# Patient Record
Sex: Male | Born: 1935 | Race: White | Hispanic: No | Marital: Single | Smoking: Never smoker
Health system: Southern US, Community
[De-identification: ages and names within clinical notes are randomized; demographics above are authoritative.]

## PROBLEM LIST (undated history)

## (undated) DIAGNOSIS — I272 Pulmonary hypertension, unspecified: Secondary | ICD-10-CM

## (undated) DIAGNOSIS — D61818 Other pancytopenia: Secondary | ICD-10-CM

## (undated) DIAGNOSIS — I350 Nonrheumatic aortic (valve) stenosis: Secondary | ICD-10-CM

## (undated) HISTORY — PX: TRANSURETHRAL RESECTION OF PROSTATE: SHX73

## (undated) HISTORY — PX: PORTACATH PLACEMENT: SHX2246

---

## 2015-08-24 DIAGNOSIS — D591 Autoimmune hemolytic anemia, unspecified: Secondary | ICD-10-CM

## 2015-08-24 DIAGNOSIS — C8470 Anaplastic large cell lymphoma, ALK-negative, unspecified site: Secondary | ICD-10-CM

## 2015-08-24 HISTORY — DX: Anaplastic large cell lymphoma, ALK-negative, unspecified site: C84.70

## 2015-08-24 HISTORY — DX: Autoimmune hemolytic anemia, unspecified: D59.10

## 2015-09-04 DIAGNOSIS — C8470 Anaplastic large cell lymphoma, ALK-negative, unspecified site: Secondary | ICD-10-CM

## 2021-01-02 ENCOUNTER — Emergency Department: Payer: Medicare Other

## 2021-01-02 ENCOUNTER — Encounter: Payer: Self-pay | Admitting: Family Medicine

## 2021-01-02 ENCOUNTER — Other Ambulatory Visit: Payer: Self-pay

## 2021-01-02 ENCOUNTER — Inpatient Hospital Stay
Admission: EM | Admit: 2021-01-02 | Discharge: 2021-01-08 | DRG: 871 | Disposition: A | Discharge status: Home Health | Payer: Medicare Other | Attending: Internal Medicine | Admitting: Internal Medicine

## 2021-01-02 DIAGNOSIS — I083 Combined rheumatic disorders of mitral, aortic and tricuspid valves: Secondary | ICD-10-CM | POA: Diagnosis present

## 2021-01-02 DIAGNOSIS — D61818 Other pancytopenia: Secondary | ICD-10-CM | POA: Diagnosis present

## 2021-01-02 DIAGNOSIS — Z808 Family history of malignant neoplasm of other organs or systems: Secondary | ICD-10-CM

## 2021-01-02 DIAGNOSIS — A419 Sepsis, unspecified organism: Principal | ICD-10-CM

## 2021-01-02 DIAGNOSIS — J9601 Acute respiratory failure with hypoxia: Secondary | ICD-10-CM

## 2021-01-02 DIAGNOSIS — Z886 Allergy status to analgesic agent status: Secondary | ICD-10-CM | POA: Diagnosis not present

## 2021-01-02 DIAGNOSIS — C8510 Unspecified B-cell lymphoma, unspecified site: Secondary | ICD-10-CM | POA: Diagnosis present

## 2021-01-02 DIAGNOSIS — R161 Splenomegaly, not elsewhere classified: Secondary | ICD-10-CM | POA: Diagnosis present

## 2021-01-02 DIAGNOSIS — J189 Pneumonia, unspecified organism: Secondary | ICD-10-CM | POA: Diagnosis not present

## 2021-01-02 DIAGNOSIS — R011 Cardiac murmur, unspecified: Secondary | ICD-10-CM | POA: Diagnosis not present

## 2021-01-02 DIAGNOSIS — N400 Enlarged prostate without lower urinary tract symptoms: Secondary | ICD-10-CM | POA: Diagnosis present

## 2021-01-02 DIAGNOSIS — Z85828 Personal history of other malignant neoplasm of skin: Secondary | ICD-10-CM

## 2021-01-02 DIAGNOSIS — H409 Unspecified glaucoma: Secondary | ICD-10-CM | POA: Diagnosis present

## 2021-01-02 DIAGNOSIS — E871 Hypo-osmolality and hyponatremia: Secondary | ICD-10-CM | POA: Diagnosis not present

## 2021-01-02 DIAGNOSIS — Z823 Family history of stroke: Secondary | ICD-10-CM

## 2021-01-02 DIAGNOSIS — M109 Gout, unspecified: Secondary | ICD-10-CM | POA: Diagnosis present

## 2021-01-02 DIAGNOSIS — E222 Syndrome of inappropriate secretion of antidiuretic hormone: Secondary | ICD-10-CM | POA: Diagnosis present

## 2021-01-02 DIAGNOSIS — Z20822 Contact with and (suspected) exposure to covid-19: Secondary | ICD-10-CM | POA: Diagnosis present

## 2021-01-02 DIAGNOSIS — Z79899 Other long term (current) drug therapy: Secondary | ICD-10-CM | POA: Diagnosis not present

## 2021-01-02 DIAGNOSIS — I452 Bifascicular block: Secondary | ICD-10-CM | POA: Diagnosis present

## 2021-01-02 DIAGNOSIS — R188 Other ascites: Secondary | ICD-10-CM

## 2021-01-02 DIAGNOSIS — C8300 Small cell B-cell lymphoma, unspecified site: Secondary | ICD-10-CM | POA: Diagnosis not present

## 2021-01-02 DIAGNOSIS — E860 Dehydration: Secondary | ICD-10-CM | POA: Diagnosis present

## 2021-01-02 DIAGNOSIS — C8590 Non-Hodgkin lymphoma, unspecified, unspecified site: Secondary | ICD-10-CM | POA: Diagnosis not present

## 2021-01-02 LAB — CBC WITH DIFFERENTIAL/PLATELET
Abs Immature Granulocytes: 0.02 10*3/uL (ref 0.00–0.07)
Basophils Absolute: 0 10*3/uL (ref 0.0–0.1)
Basophils Relative: 1 %
Eosinophils Absolute: 0 10*3/uL (ref 0.0–0.5)
Eosinophils Relative: 1 %
HCT: 28.6 % — ABNORMAL LOW (ref 39.0–52.0)
Hemoglobin: 9.9 g/dL — ABNORMAL LOW (ref 13.0–17.0)
Immature Granulocytes: 1 %
Lymphocytes Relative: 4 %
Lymphs Abs: 0.1 10*3/uL — ABNORMAL LOW (ref 0.7–4.0)
MCH: 27.5 pg (ref 26.0–34.0)
MCHC: 34.6 g/dL (ref 30.0–36.0)
MCV: 79.4 fL — ABNORMAL LOW (ref 80.0–100.0)
Monocytes Absolute: 0.1 10*3/uL (ref 0.1–1.0)
Monocytes Relative: 8 %
Neutro Abs: 1.4 10*3/uL — ABNORMAL LOW (ref 1.7–7.7)
Neutrophils Relative %: 85 %
Platelets: 118 10*3/uL — ABNORMAL LOW (ref 150–400)
RBC: 3.6 MIL/uL — ABNORMAL LOW (ref 4.22–5.81)
RDW: 14.6 % (ref 11.5–15.5)
WBC: 1.7 10*3/uL — ABNORMAL LOW (ref 4.0–10.5)
nRBC: 0 % (ref 0.0–0.2)

## 2021-01-02 LAB — COMPREHENSIVE METABOLIC PANEL
ALT: 10 U/L (ref 0–44)
AST: 24 U/L (ref 15–41)
Albumin: 3 g/dL — ABNORMAL LOW (ref 3.5–5.0)
Alkaline Phosphatase: 62 U/L (ref 38–126)
Anion gap: 9 (ref 5–15)
BUN: 18 mg/dL (ref 8–23)
CO2: 21 mmol/L — ABNORMAL LOW (ref 22–32)
Calcium: 8.4 mg/dL — ABNORMAL LOW (ref 8.9–10.3)
Chloride: 97 mmol/L — ABNORMAL LOW (ref 98–111)
Creatinine, Ser: 1.15 mg/dL (ref 0.61–1.24)
GFR, Estimated: >60 mL/min (ref >60)
Glucose, Bld: 129 mg/dL — ABNORMAL HIGH (ref 70–99)
Potassium: 3.7 mmol/L (ref 3.5–5.1)
Sodium: 127 mmol/L — ABNORMAL LOW (ref 135–145)
Total Bilirubin: 0.7 mg/dL (ref 0.3–1.2)
Total Protein: 5.8 g/dL — ABNORMAL LOW (ref 6.5–8.1)

## 2021-01-02 LAB — URINALYSIS, COMPLETE (UACMP) WITH MICROSCOPIC
Bacteria, UA: NONE SEEN
Bilirubin Urine: NEGATIVE
Glucose, UA: NEGATIVE mg/dL
Ketones, ur: NEGATIVE mg/dL
Leukocytes,Ua: NEGATIVE
Nitrite: NEGATIVE
Protein, ur: 30 mg/dL — AB
Specific Gravity, Urine: 1.009 (ref 1.005–1.030)
Squamous Epithelial / HPF: NONE SEEN (ref 0–5)
pH: 5 (ref 5.0–8.0)

## 2021-01-02 LAB — RESP PANEL BY RT-PCR (FLU A&B, COVID) ARPGX2
Influenza A by PCR: NEGATIVE
Influenza B by PCR: NEGATIVE
SARS Coronavirus 2 by RT PCR: NEGATIVE

## 2021-01-02 LAB — LACTIC ACID, PLASMA: Lactic Acid, Venous: 1.4 mmol/L (ref 0.5–1.9)

## 2021-01-02 LAB — TROPONIN I (HIGH SENSITIVITY)
Troponin I (High Sensitivity): 10 ng/L (ref <18)
Troponin I (High Sensitivity): 12 ng/L (ref <18)

## 2021-01-02 LAB — BRAIN NATRIURETIC PEPTIDE: B Natriuretic Peptide: 94.8 pg/mL (ref 0.0–100.0)

## 2021-01-02 MED ORDER — SODIUM CHLORIDE 0.9 % IV SOLN: INTRAVENOUS | Status: DC

## 2021-01-02 MED ORDER — LATANOPROST 0.005 % OP SOLN
1.0000 [drp] | Freq: Every day | OPHTHALMIC | Status: DC
  Administered 2021-01-02 – 2021-01-07 (×6): 1 [drp] via OPHTHALMIC
  Filled 2021-01-02: qty 2.5

## 2021-01-02 MED ORDER — DM-GUAIFENESIN ER 30-600 MG PO TB12
1.0000 | ORAL_TABLET | Freq: Two times a day (BID) | ORAL | Status: DC | PRN
  Administered 2021-01-02: 1 via ORAL
  Filled 2021-01-02: qty 1

## 2021-01-02 MED ORDER — ONDANSETRON HCL 4 MG/2ML IJ SOLN: 4.0000 mg | Freq: Four times a day (QID) | INTRAMUSCULAR | Status: DC | PRN

## 2021-01-02 MED ORDER — TAMSULOSIN HCL 0.4 MG PO CAPS
0.4000 mg | ORAL_CAPSULE | Freq: Every day | ORAL | Status: DC
  Administered 2021-01-02 – 2021-01-08 (×7): 0.4 mg via ORAL
  Filled 2021-01-02 (×7): qty 1

## 2021-01-02 MED ORDER — CEFTRIAXONE SODIUM 2 G IJ SOLR
2.0000 g | INTRAMUSCULAR | Status: AC
  Administered 2021-01-02 – 2021-01-06 (×5): 2 g via INTRAVENOUS
  Filled 2021-01-02 (×4): qty 2
  Filled 2021-01-02: qty 20

## 2021-01-02 MED ORDER — MAGNESIUM GLUCONATE 500 MG PO TABS
500.0000 mg | ORAL_TABLET | Freq: Every day | ORAL | Status: DC
  Administered 2021-01-03 – 2021-01-08 (×6): 500 mg via ORAL
  Filled 2021-01-02 (×6): qty 1

## 2021-01-02 MED ORDER — ENOXAPARIN SODIUM 40 MG/0.4ML IJ SOSY
40.0000 mg | PREFILLED_SYRINGE | INTRAMUSCULAR | Status: DC
  Administered 2021-01-02: 17:00:00 40 mg via SUBCUTANEOUS
  Filled 2021-01-02 (×2): qty 0.4

## 2021-01-02 MED ORDER — SODIUM CHLORIDE 0.9 % IV SOLN
500.0000 mg | INTRAVENOUS | Status: DC
  Administered 2021-01-02 – 2021-01-03 (×2): 500 mg via INTRAVENOUS
  Filled 2021-01-02 (×3): qty 500

## 2021-01-02 MED ORDER — ONDANSETRON HCL 4 MG PO TABS: 4.0000 mg | ORAL_TABLET | Freq: Four times a day (QID) | ORAL | Status: DC | PRN

## 2021-01-02 MED ORDER — FERROUS SULFATE 325 (65 FE) MG PO TABS
325.0000 mg | ORAL_TABLET | Freq: Every day | ORAL | Status: DC
  Administered 2021-01-03 – 2021-01-08 (×6): 325 mg via ORAL
  Filled 2021-01-02 (×6): qty 1

## 2021-01-02 MED ORDER — ALLOPURINOL 100 MG PO TABS
300.0000 mg | ORAL_TABLET | Freq: Every day | ORAL | Status: DC
  Administered 2021-01-02 – 2021-01-08 (×7): 300 mg via ORAL
  Filled 2021-01-02 (×3): qty 3
  Filled 2021-01-02: qty 1
  Filled 2021-01-02 (×4): qty 3

## 2021-01-02 MED ORDER — MAGNESIUM HYDROXIDE 400 MG/5ML PO SUSP
30.0000 mL | Freq: Every day | ORAL | Status: DC | PRN
  Filled 2021-01-02: qty 30

## 2021-01-02 MED ORDER — DM-GUAIFENESIN ER 30-600 MG PO TB12
1.0000 | ORAL_TABLET | Freq: Two times a day (BID) | ORAL | Status: DC
  Administered 2021-01-02 – 2021-01-08 (×12): 1 via ORAL
  Filled 2021-01-02 (×12): qty 1

## 2021-01-02 MED ORDER — TRAZODONE HCL 50 MG PO TABS
25.0000 mg | ORAL_TABLET | Freq: Every evening | ORAL | Status: DC | PRN
  Administered 2021-01-02 – 2021-01-07 (×3): 25 mg via ORAL
  Filled 2021-01-02 (×4): qty 1

## 2021-01-02 MED ORDER — VITAMIN B-12 1000 MCG PO TABS
1000.0000 ug | ORAL_TABLET | Freq: Every day | ORAL | Status: DC
  Administered 2021-01-03 – 2021-01-08 (×6): 1000 ug via ORAL
  Filled 2021-01-02 (×6): qty 1

## 2021-01-02 NOTE — ED Provider Notes (Addendum)
[facility] Emergency Department Provider Note  ____________________________________________   Event Date/Time   First MD Initiated Contact with Patient 01/02/21 1352     (approximate)  I have reviewed the triage vital signs and the nursing notes.   HISTORY  Chief Complaint Weakness    HPI Harry Strong is a 86 y.o. male here with generalized weakness and shortness of breath.  The patient states that for the last 6 weeks or so, he has had progressively worsening general fatigue, loss of appetite, shortness of breath.  He currently splits his time between [place] and [place].  He had an appointment with his cancer doctor for B-cell lymphoma at the end of April and he states everything looked okay.  He states his counts were acceptable.  He states that over the last several weeks, he has felt fatigued.  He has not wanted to eat or drink much.  He has had progressive shortness of breath, worse with exertion.  He is having to stop to rest to breathe with even minimal exertion.  No chest pain.  He has had some mild leg swelling.  No fevers.  Denies preceding URI or other infectious symptoms.  No abdominal pain.        History reviewed. No pertinent past medical history.  Patient Active Problem List   Diagnosis Date Noted  . Multifocal pneumonia 01/02/2021      Prior to Admission medications   Medication Sig Start Date End Date Taking? Authorizing Provider  allopurinol (ZYLOPRIM) 300 MG tablet Take 300 mg by mouth daily.   Yes [provider]  ferrous sulfate 325 (65 FE) MG tablet Take 1 mg by mouth daily.   Yes [provider]  latanoprost (XALATAN) 0.005 % ophthalmic solution Place 1 drop into both eyes at bedtime.   Yes [provider]  magnesium gluconate (MAGONATE) 500 MG tablet Take 500 mg by mouth daily.   Yes [provider]  tamsulosin (FLOMAX) 0.4 MG CAPS capsule Take 0.4 mg by mouth daily.   Yes  [provider]  vitamin B-12 (CYANOCOBALAMIN) 1000 MCG tablet Take 1,000 mcg by mouth daily.   Yes [provider]    Allergies Aspirin and Tylenol [acetaminophen]  History reviewed. No pertinent family history.  Social History Social History   Tobacco Use  . Smoking status: Never Smoker  Substance Use Topics  . Alcohol use: Not Currently  . Drug use: Never    Review of Systems  Review of Systems  Constitutional: Positive for appetite change and fatigue. Negative for chills and fever.  HENT: Negative for sore throat.   Respiratory: Positive for shortness of breath.   Cardiovascular: Negative for chest pain.  Gastrointestinal: Negative for abdominal pain.  Genitourinary: Negative for flank pain.  Musculoskeletal: Negative for neck pain.  Skin: Negative for rash and wound.  Allergic/Immunologic: Negative for immunocompromised state.  Neurological: Positive for weakness. Negative for numbness.  Hematological: Does not bruise/bleed easily.  All other systems reviewed and are negative.    ____________________________________________  PHYSICAL EXAM:      VITAL SIGNS: ED Triage Vitals  Enc Vitals Group     BP 01/02/21 1351 135/69     Pulse Rate 01/02/21 1351 78     Resp 01/02/21 1351 18     Temp 01/02/21 1351 98.6 F (37 C)     Temp Source 01/02/21 1351 Oral     SpO2 01/02/21 1351 93 %     Weight 01/02/21 1352  180 lb (81.6 kg)     Height 01/02/21 1352 5\' 9"  (1.753 m)     Head Circumference --      Peak Flow --      Pain Score 01/02/21 1352 0     Pain Loc --      Pain Edu? --      Excl. in [place]? --      Physical Exam Vitals and nursing note reviewed.  Constitutional:      General: He is not in acute distress.    Appearance: He is well-developed.  HENT:     Head: Normocephalic and atraumatic.  Eyes:     Conjunctiva/sclera: Conjunctivae normal.  Cardiovascular:     Rate and Rhythm: Normal rate and regular rhythm.     Heart sounds: Normal  heart sounds. No murmur heard. No friction rub.  Pulmonary:     Effort: Pulmonary effort is normal. No respiratory distress.     Breath sounds: Examination of the right-upper field reveals rales. Examination of the left-upper field reveals rales. Examination of the right-middle field reveals rales. Examination of the left-middle field reveals rales. Examination of the right-lower field reveals rales. Examination of the left-lower field reveals rales. Rales present. No wheezing.  Abdominal:     General: There is no distension.     Palpations: Abdomen is soft.     Tenderness: There is no abdominal tenderness.  Musculoskeletal:     Cervical back: Neck supple.     Right lower leg: Edema present.     Left lower leg: Edema present.  Skin:    General: Skin is warm.     Capillary Refill: Capillary refill takes less than 2 seconds.  Neurological:     Mental Status: He is alert and oriented to person, place, and time.     Motor: No abnormal muscle tone.       ____________________________________________   LABS (all labs ordered are listed, but only abnormal results are displayed)  Labs Reviewed  CBC WITH DIFFERENTIAL/PLATELET - Abnormal; Notable for the following components:      Result Value   WBC 1.7 (*)    RBC 3.60 (*)    Hemoglobin 9.9 (*)    HCT 28.6 (*)    MCV 79.4 (*)    Platelets 118 (*)    Neutro Abs 1.4 (*)    Lymphs Abs 0.1 (*)    All other components within normal limits  COMPREHENSIVE METABOLIC PANEL - Abnormal; Notable for the following components:   Sodium 127 (*)    Chloride 97 (*)    CO2 21 (*)    Glucose, Bld 129 (*)    Calcium 8.4 (*)    Total Protein 5.8 (*)    Albumin 3.0 (*)    All other components within normal limits  URINALYSIS, COMPLETE (UACMP) WITH MICROSCOPIC - Abnormal; Notable for the following components:   Color, Urine YELLOW (*)    APPearance CLEAR (*)    Hgb urine dipstick SMALL (*)    Protein, ur 30 (*)    All other components within  normal limits  RESP PANEL BY RT-PCR (FLU A&B, COVID) ARPGX2  BRAIN NATRIURETIC PEPTIDE  BASIC METABOLIC PANEL  CBC  TROPONIN I (HIGH SENSITIVITY)  TROPONIN I (HIGH SENSITIVITY)    ____________________________________________  EKG: Normal sinus rhythm, VR 77. PR 217, QRS 156, QTc 435. Bolrderline prolonged PR. RBBB. No acute ST elevation or depression. ________________________________________  RADIOLOGY All imaging, including plain films, CT scans, and ultrasounds, independently  reviewed by me, and interpretations confirmed via formal radiology reads.  ED MD interpretation:   CXR: Multifocal PNA vs nodules  Official radiology report(s): CT Chest Wo Contrast  Result Date: 01/02/2021 CLINICAL DATA:  Cough, lung nodules, generalized weakness, shortness of breath EXAM: CT CHEST WITHOUT CONTRAST TECHNIQUE: Multidetector CT imaging of the chest was performed following the standard protocol without IV contrast. Sagittal and coronal MPR images reconstructed from axial data set. COMPARISON:  Chest radiograph 01/02/2021 FINDINGS: Cardiovascular: Atherosclerotic calcifications aorta, proximal great vessels and coronary arteries. Upper normal caliber ascending thoracic aorta 3.9 cm diameter. Mitral annular and aortic valvular calcifications. No pericardial effusion. RIGHT jugular Port-A-Cath with tip in SVC. Mediastinum/Nodes: Esophagus unremarkable. Base of cervical region normal appearance. Few scattered normal size mediastinal lymph nodes without definite thoracic adenopathy. Lungs/Pleura: Small BILATERAL pleural effusions. Patchy predominantly peribronchial infiltrates throughout both lungs involving all lobes consistent with multifocal pneumonia. No pneumothorax. No discrete pulmonary mass. Upper Abdomen: Spleen appears enlarged, 13.6 x 8.9 cm image 145. Remaining visualized upper abdomen unremarkable. Musculoskeletal: No osseous abnormalities. Tiny subcutaneous nodule presternal 11 mm greatest  diameter image 74 question epidermal inclusion cyst versus sebaceous cyst. IMPRESSION: Extensive BILATERAL patchy pulmonary infiltrates, predominantly perivascular, consistent with multifocal pneumonia. Radiographic follow-up until resolution recommended to exclude underlying pulmonary nodules. Small BILATERAL pleural effusions. Mild splenic enlargement. Scattered atherosclerotic calcifications including coronary arteries. Aortic Atherosclerosis (ICD10-I70.0). Electronically Signed   By: Lavonia Dana M.D.   On: 01/02/2021 15:55   DG Chest Portable 1 View  Result Date: 01/02/2021 CLINICAL DATA:  Cough and short of breath EXAM: PORTABLE CHEST 1 VIEW COMPARISON:  None. FINDINGS: Right-sided central venous port tip over the distal SVC. Widespread bilateral airspace opacities some of which are nodular in appearance. Possible small right effusion. Borderline cardiomegaly with aortic atherosclerosis. No pneumothorax. IMPRESSION: Widespread bilateral airspace opacities, some of which are nodular in appearance. Findings could be secondary to diffuse bilateral pneumonia, however given nodular appearance, the possibility of pulmonary nodules is also raised. Suggest chest CT for further evaluation. Electronically Signed   By: Donavan Foil M.D.   On: 01/02/2021 15:26    ____________________________________________  PROCEDURES   Procedure(s) performed (including Critical Care):  .1-3 Lead EKG Interpretation Performed by: Duffy Bruce, MD Authorized by: Duffy Bruce, MD     Interpretation: normal     ECG rate:  70-80   ECG rate assessment: normal     Rhythm: sinus rhythm     Ectopy: none     Conduction: normal   Comments:     Indication: sob    ____________________________________________  INITIAL IMPRESSION / MDM / ASSESSMENT AND PLAN / ED COURSE  As part of my medical decision making, I reviewed the following data within the [facility] notes reviewed and  incorporated, Old chart reviewed, Notes from prior ED visits, and [place] Controlled Substance Database       *Gill Delrossi was evaluated in Emergency Department on 01/02/2021 for the symptoms described in the history of present illness. He was evaluated in the context of the global COVID-19 pandemic, which necessitated consideration that the patient might be at risk for infection with the SARS-CoV-2 virus that causes COVID-19. Institutional protocols and algorithms that pertain to the evaluation of patients at risk for COVID-19 are in a state of rapid change based on information released by regulatory bodies including the CDC and federal and state organizations. These policies and algorithms were followed during the patient's care in the ED.  Some ED  evaluations and interventions may be delayed as a result of limited staffing during the pandemic.*     Medical Decision Making:  86 yo M here with generalized weakness and loss of appetite. CXR shows multifocal PNA vs nodules. Suspect possible viral or atypical PNA, COVID, possibly metastatic disease with h/o B-Cell lymphoma. No known pulmonary history. He is mildly hypoxic with minimal exertion in ED. EKG nonischemic, no CP or signs of ACS. No clinical evidence of PE. Labs show significant hyponatremia, likely related to his pulm disease and mild dehydration. Pt also pancytopenic, which could be related to his lymphoma though also viral marrow suppression. Will admit for further management. CT pending.   ____________________________________________  FINAL CLINICAL IMPRESSION(S) / ED DIAGNOSES  Final diagnoses:  Acute respiratory failure with hypoxia (HCC)  Multifocal pneumonia     MEDICATIONS GIVEN DURING THIS VISIT:  Medications  allopurinol (ZYLOPRIM) tablet 300 mg (300 mg Oral Given 01/02/21 1714)  tamsulosin (FLOMAX) capsule 0.4 mg (0.4 mg Oral Given 01/02/21 1712)  ferrous sulfate tablet 325 mg (has no administration in time range)   magnesium gluconate (MAGONATE) tablet 500 mg (has no administration in time range)  vitamin B-12 (CYANOCOBALAMIN) tablet 1,000 mcg (has no administration in time range)  latanoprost (XALATAN) 0.005 % ophthalmic solution 1 drop (has no administration in time range)  enoxaparin (LOVENOX) injection 40 mg (40 mg Subcutaneous Given 01/02/21 1712)  0.9 %  sodium chloride infusion ( Intravenous New Bag/Given 01/02/21 1706)  traZODone (DESYREL) tablet 25 mg (has no administration in time range)  magnesium hydroxide (MILK OF MAGNESIA) suspension 30 mL (has no administration in time range)  ondansetron (ZOFRAN) tablet 4 mg (has no administration in time range)    Or  ondansetron (ZOFRAN) injection 4 mg (has no administration in time range)  cefTRIAXone (ROCEPHIN) 2 g in sodium chloride 0.9 % 100 mL IVPB (2 g Intravenous New Bag/Given 01/02/21 1714)  azithromycin (ZITHROMAX) 500 mg in sodium chloride 0.9 % 250 mL IVPB (has no administration in time range)  dextromethorphan-guaiFENesin (MUCINEX DM) 30-600 MG per 12 hr tablet 1 tablet (has no administration in time range)  dextromethorphan-guaiFENesin (MUCINEX DM) 30-600 MG per 12 hr tablet 1 tablet (1 tablet Oral Given 01/02/21 1711)     ED Discharge Orders    None       Note:  This document was prepared using Dragon voice recognition software and may include unintentional dictation errors.   Duffy Bruce, MD 01/02/21 1719    Duffy Bruce, MD 01/02/21 1719

## 2021-01-02 NOTE — ED Triage Notes (Signed)
Pt states that he has had increased weakness for the past 6 weeks states that he thought may be it was his blood Oxygen level but his pcp said that was normal, pt reports lack of appetite and weakness that isn't resolving pt denies dark stools and states that due to his lack of intake he has had less bm's

## 2021-01-02 NOTE — ED Notes (Signed)
Pt states that he is having generalized weakness and shortness of breath for about 6 weeks. Pt has hx/o cancer.   Pt SpO2 92% on room air, drops to 91% when talking. EDP at bedside.

## 2021-01-02 NOTE — ED Notes (Signed)
Attending MD at bedside.

## 2021-01-02 NOTE — H&P (Addendum)
[place]   PATIENT NAME: Harry Strong    MR#:  [id number]  DATE OF BIRTH:  1935/05/28  DATE OF ADMISSION:  01/02/2021  PRIMARY CARE PHYSICIAN: Pcp, No   Patient is coming from: Home  REQUESTING/REFERRING PHYSICIAN: Duffy Bruce, MD CHIEF COMPLAINT:   Chief Complaint  Patient presents with  . Weakness    HISTORY OF PRESENT ILLNESS:  Harry Strong is a 86 y.o. male with medical history significant for B-cell lymphoma, BPH, gout and pancytopenia, who presented to the ER with acute onset of worsening dyspnea with associated to cough productive of clear sputum without wheezing.  He has been having intermittent low-grade fever that was up to 99 over the last few days.  He denies any chills.  No nausea or vomiting or abdominal pain.  He denies any chest pain or palpitations.  No dysuria, degree of hematuria or flank pain.  ED Course: When he came to the ER vital signs were within normal.  His pulse oximetry briefly dropped to 88% on room air.  Later respiratory rate was 27 then 25.  Labs revealed leukopenia of 1.7 with anemia of 9.9/28.6 and thrombocytopenia 118 with neutropenia with ANC of 1.4.  UA was unremarkable EKG as reviewed by me : Showed sinus rhythm with a rate of 77 with borderline prolonged PR interval, right bundle blanch block and left anterior fascicular block with Q waves anteroseptally. Imaging: Chest x-ray showed widespread bilateral airspace opacities, some of which are nodular in appearance. Findings could be secondary to diffuse bilateral pneumonia, however given nodular appearance, the possibility of pulmonary nodules is also raised. Chest CT without contrast revealed: Extensive BILATERAL patchy pulmonary infiltrates, predominantly perivascular, consistent with multifocal pneumonia.  Radiographic follow-up until resolution recommended to exclude underlying pulmonary nodules.  Small BILATERAL pleural effusions.  Mild splenic  enlargement.  Scattered atherosclerotic calcifications including coronary arteries.  Aortic Atherosclerosis  The patient will be admitted to a medical monitored bed for further evaluation and management. PAST MEDICAL HISTORY:  B-cell lymphoma, BPH, gout and pancytopenia as well as facial skin cancer.  PAST SURGICAL HISTORY:  TURP per his report.  SOCIAL HISTORY:   Social History   Tobacco Use  . Smoking status: Not on file  . Smokeless tobacco: Not on file  Substance Use Topics  . Alcohol use: Not on file  He drinks alcohol occasionally.  No history of tobacco or EtOH abuse or illicit drug use.  FAMILY HISTORY:  His father had CVA at age of 1.  History is otherwise positive for skin cancer in his brother. DRUG ALLERGIES:   Allergies  Allergen Reactions  . Aspirin Swelling  . Tylenol [Acetaminophen] Other (See Comments)    Red face, tongue, and lips    REVIEW OF SYSTEMS:   ROS As per history of present illness. All pertinent systems were reviewed above. Constitutional, HEENT, cardiovascular, respiratory, GI, GU, musculoskeletal, neuro, psychiatric, endocrine, integumentary and hematologic systems were reviewed and are otherwise negative/unremarkable except for positive findings mentioned above in the HPI.   MEDICATIONS AT HOME:   Prior to Admission medications   Medication Sig Start Date End Date Taking? Authorizing Provider  allopurinol (ZYLOPRIM) 300 MG tablet Take 300 mg by mouth daily.   Yes [provider]  ferrous sulfate 325 (65 FE) MG tablet Take 1 mg by mouth daily.   Yes [provider]  latanoprost (XALATAN) 0.005 % ophthalmic solution Place 1 drop into both eyes at bedtime.   Yes [provider]  magnesium gluconate (MAGONATE) 500 MG tablet Take 500 mg by mouth daily.   Yes [provider]  tamsulosin (FLOMAX) 0.4 MG CAPS capsule Take 0.4 mg by mouth daily.   Yes [provider]  vitamin B-12  (CYANOCOBALAMIN) 1000 MCG tablet Take 1,000 mcg by mouth daily.   Yes [provider]      VITAL SIGNS:  Blood pressure 123/81, pulse 73, temperature 98.6 F (37 C), temperature source Oral, resp. rate 19, height 5\' 9"  (1.753 m), weight 81.6 kg, SpO2 94 %.  PHYSICAL EXAMINATION:  Physical Exam  GENERAL:  86 y.o.-year-old Caucasian male patient lying in the bed with no acute distress.  EYES: Pupils equal, round, reactive to light and accommodation. No scleral icterus. Extraocular muscles intact.  HEENT: Head atraumatic, normocephalic. Oropharynx and nasopharynx clear.  NECK:  Supple, no jugular venous distention. No thyroid enlargement, no tenderness.  LUNGS: Normal breath sounds bilaterally, no wheezing, rales,rhonchi or crepitation. No use of accessory muscles of respiration.  CARDIOVASCULAR: Regular rate and rhythm, S1, S2 normal. No murmurs, rubs, or gallops.  ABDOMEN: Soft, nondistended, nontender. Bowel sounds present. No organomegaly or mass.  EXTREMITIES: No pedal edema, cyanosis, or clubbing.  NEUROLOGIC: Cranial nerves II through XII are intact. Muscle strength 5/5 in all extremities. Sensation intact. Gait not checked.  PSYCHIATRIC: The patient is alert and oriented x 3.  Normal affect and good eye contact. SKIN: He has a left facial ulcer likely at the site of treated basal cell carcinoma.  LABORATORY PANEL:   CBC Recent Labs  Lab 01/02/21 1404  WBC 1.7*  HGB 9.9*  HCT 28.6*  PLT 118*   ------------------------------------------------------------------------------------------------------------------  Chemistries  Recent Labs  Lab 01/02/21 1404  NA 127*  K 3.7  CL 97*  CO2 21*  GLUCOSE 129*  BUN 18  CREATININE 1.15  CALCIUM 8.4*  AST 24  ALT 10  ALKPHOS 62  BILITOT 0.7   ------------------------------------------------------------------------------------------------------------------  Cardiac Enzymes No results for input(s): TROPONINI in the  last 168 hours. ------------------------------------------------------------------------------------------------------------------  RADIOLOGY:  CT Chest Wo Contrast  Result Date: 01/02/2021 CLINICAL DATA:  Cough, lung nodules, generalized weakness, shortness of breath EXAM: CT CHEST WITHOUT CONTRAST TECHNIQUE: Multidetector CT imaging of the chest was performed following the standard protocol without IV contrast. Sagittal and coronal MPR images reconstructed from axial data set. COMPARISON:  Chest radiograph 01/02/2021 FINDINGS: Cardiovascular: Atherosclerotic calcifications aorta, proximal great vessels and coronary arteries. Upper normal caliber ascending thoracic aorta 3.9 cm diameter. Mitral annular and aortic valvular calcifications. No pericardial effusion. RIGHT jugular Port-A-Cath with tip in SVC. Mediastinum/Nodes: Esophagus unremarkable. Base of cervical region normal appearance. Few scattered normal size mediastinal lymph nodes without definite thoracic adenopathy. Lungs/Pleura: Small BILATERAL pleural effusions. Patchy predominantly peribronchial infiltrates throughout both lungs involving all lobes consistent with multifocal pneumonia. No pneumothorax. No discrete pulmonary mass. Upper Abdomen: Spleen appears enlarged, 13.6 x 8.9 cm image 145. Remaining visualized upper abdomen unremarkable. Musculoskeletal: No osseous abnormalities. Tiny subcutaneous nodule presternal 11 mm greatest diameter image 74 question epidermal inclusion cyst versus sebaceous cyst. IMPRESSION: Extensive BILATERAL patchy pulmonary infiltrates, predominantly perivascular, consistent with multifocal pneumonia. Radiographic follow-up until resolution recommended to exclude underlying pulmonary nodules. Small BILATERAL pleural effusions. Mild splenic enlargement. Scattered atherosclerotic calcifications including coronary arteries. Aortic Atherosclerosis (ICD10-I70.0). Electronically Signed   By: Lavonia Dana M.D.   On:  01/02/2021 15:55   DG Chest Portable 1 View  Result Date: 01/02/2021 CLINICAL DATA:  Cough and short of breath EXAM: PORTABLE  CHEST 1 VIEW COMPARISON:  None. FINDINGS: Right-sided central venous port tip over the distal SVC. Widespread bilateral airspace opacities some of which are nodular in appearance. Possible small right effusion. Borderline cardiomegaly with aortic atherosclerosis. No pneumothorax. IMPRESSION: Widespread bilateral airspace opacities, some of which are nodular in appearance. Findings could be secondary to diffuse bilateral pneumonia, however given nodular appearance, the possibility of pulmonary nodules is also raised. Suggest chest CT for further evaluation. Electronically Signed   By: Donavan Foil M.D.   On: 01/02/2021 15:26      IMPRESSION AND PLAN:  Active Problems:   Multifocal pneumonia  1.  Multifocal pneumonia with subsequent acute respiratory failure with hypoxia.  The patient has subsequent sepsis as manifested by leukopenia and tachypnea. - The patient will be admitted to a medical monitored bed. - Will place the patient on IV Rocephin and Zithromax. - We will follow blood and check sputum culture. - Mucolytic therapy as well as bronchodilator therapy will be provided. - O2 protocol will be followed. - We will follow blood cultures. - The patient will be hydrated with IV normal saline. - We will follow lactic acid level.  2.  Pancytopenia with neutropenia and B-cell lymphoma. - The patient will be placed on reverse isolation and will follow CBC.. - Continues vitamin B12.  3.  BPH. - We will continue Flomax.  4. Gout. - Continue allopurinol.  5.  Glaucoma. - We will continue his ophthalmic gtt.  DVT prophylaxis: Lovenox. Code Status: full code. Family Communication:  The plan of care was discussed in details with the patient (and his friend who was with him in the room). I answered all questions. The patient agreed to proceed with the above  mentioned plan. Further management will depend upon hospital course. Disposition Plan: Back to previous home environment Consults called: none.   All the records are reviewed and case discussed with ED provider.  Status is: Inpatient  Remains inpatient appropriate because:Ongoing diagnostic testing needed not appropriate for outpatient work up, Unsafe d/c plan, IV treatments appropriate due to intensity of illness or inability to take PO and Inpatient level of care appropriate due to severity of illness   Dispo: The patient is from: Home              Anticipated d/c is to: Home              Patient currently is not medically stable to d/c.   Difficult to place patient No      TOTAL TIME TAKING CARE OF THIS PATIENT: 55 minutes.    Christel Mormon M.D on 01/02/2021 at 3:59 PM  [facility]   From 7 PM-7 AM, contact night-coverage [email or web address]  CC: Primary care physician; Pcp, No

## 2021-01-02 NOTE — ED Notes (Addendum)
Stood patient up. Oxygen dropped to 88% on RA. Xray at bedside.

## 2021-01-02 NOTE — ED Notes (Signed)
2nd message sent to IP RN for handoff

## 2021-01-03 DIAGNOSIS — J9601 Acute respiratory failure with hypoxia: Secondary | ICD-10-CM

## 2021-01-03 DIAGNOSIS — E871 Hypo-osmolality and hyponatremia: Secondary | ICD-10-CM

## 2021-01-03 LAB — CBC
HCT: 27.4 % — ABNORMAL LOW (ref 39.0–52.0)
Hemoglobin: 9.4 g/dL — ABNORMAL LOW (ref 13.0–17.0)
MCH: 27.6 pg (ref 26.0–34.0)
MCHC: 34.3 g/dL (ref 30.0–36.0)
MCV: 80.4 fL (ref 80.0–100.0)
Platelets: 94 10*3/uL — ABNORMAL LOW (ref 150–400)
RBC: 3.41 MIL/uL — ABNORMAL LOW (ref 4.22–5.81)
RDW: 14.6 % (ref 11.5–15.5)
WBC: 1.3 10*3/uL — CL (ref 4.0–10.5)
nRBC: 0 % (ref 0.0–0.2)

## 2021-01-03 LAB — BASIC METABOLIC PANEL
Anion gap: 7 (ref 5–15)
BUN: 19 mg/dL (ref 8–23)
CO2: 22 mmol/L (ref 22–32)
Calcium: 8.1 mg/dL — ABNORMAL LOW (ref 8.9–10.3)
Chloride: 98 mmol/L (ref 98–111)
Creatinine, Ser: 1.08 mg/dL (ref 0.61–1.24)
GFR, Estimated: >60 mL/min (ref >60)
Glucose, Bld: 104 mg/dL — ABNORMAL HIGH (ref 70–99)
Potassium: 3.8 mmol/L (ref 3.5–5.1)
Sodium: 127 mmol/L — ABNORMAL LOW (ref 135–145)

## 2021-01-03 LAB — LACTIC ACID, PLASMA: Lactic Acid, Venous: 0.9 mmol/L (ref 0.5–1.9)

## 2021-01-03 NOTE — Plan of Care (Signed)
Notified on call MD of critical WBC of 1.3 - was 1.7. No new orders.

## 2021-01-03 NOTE — Progress Notes (Signed)
Order requisition for Advanced Directive information. Attempted to meet with patient, he was sleeping. Left packet on table with other personal items. Will follow up.

## 2021-01-03 NOTE — Progress Notes (Addendum)
PROGRESS NOTE    Harry Strong  [id number] DOB: 1934-11-24 DOA: 01/02/2021 PCP: Merryl Hacker, No    Brief Narrative:  Harry Strong is a 86 y.o. male with medical history significant for B-cell lymphoma, BPH, gout and pancytopenia, who presented to the ER with acute onset of worsening dyspnea with associated to cough productive of clear sputum without wheezing.  He has been having intermittent low-grade fever that was up to 99 over the last few days.  He denies any chills.  No nausea or vomiting or abdominal pain.  He denies any chest pain or palpitations.  No dysuria, degree of hematuria or flank pain. ED Course: When he came to the ER vital signs were within normal.  His pulse oximetry briefly dropped to 88% on room air.  Later respiratory rate was 27 then 25.  Labs revealed leukopenia of 1.7 with anemia of 9.9/28.6 and thrombocytopenia 118 with neutropenia with ANC of 1.4.  UA was unremarkable  cxr with b/l infiltrates. Being treated for PAN.  5/14- pt reports "feeling ok", little better.  On 2 L satting 94%.  Reports could not sleep last night due to interruptions. Still feels sob.    Consultants:     Procedures:   Antimicrobials:   Ceftriaxone and azithromycin 5/13>>>>   Subjective: No chest pain.  Still with sob  Objective: Vitals:   01/02/21 2114 01/03/21 0018 01/03/21 0457 01/03/21 0745  BP: 110/61 (!) 116/57 116/62 104/61  Pulse: 75 75 66 68  Resp: 17 18 16 17   Temp: 99 F (37.2 C) 98.7 F (37.1 C) 98.3 F (36.8 C) 98.2 F (36.8 C)  TempSrc:      SpO2: (!) 87% 95% 96% 94%  Weight:      Height:        Intake/Output Summary (Last 24 hours) at 01/03/2021 0816 Last data filed at 01/03/2021 0418 Gross per 24 hour  Intake 362.15 ml  Output 950 ml  Net -587.85 ml   Filed Weights   01/02/21 1352  Weight: 81.6 kg    Examination:  General exam: Appears calm and comfortable, pale Respiratory system: Positive crackles bilaterally right more than left, no  wheezing Cardiovascular system: S1 & S2 heard, RRR. No JVD, murmurs, rubs, gallops or clicks.  Gastrointestinal system: Abdomen is nondistended, soft and nontender.. Normal bowel sounds heard. Central nervous system: Alert and oriented.  Grossly intact Extremities: No edema Skin: Warm dry Psychiatry:Mood & affect appropriate.     Data Reviewed: I have personally reviewed following labs and imaging studies  CBC: Recent Labs  Lab 01/02/21 1404 01/03/21 0153  WBC 1.7* 1.3*  NEUTROABS 1.4*  --   HGB 9.9* 9.4*  HCT 28.6* 27.4*  MCV 79.4* 80.4  PLT 118* 94*   Basic Metabolic Panel: Recent Labs  Lab 01/02/21 1404 01/03/21 0153  NA 127* 127*  K 3.7 3.8  CL 97* 98  CO2 21* 22  GLUCOSE 129* 104*  BUN 18 19  CREATININE 1.15 1.08  CALCIUM 8.4* 8.1*   GFR: Estimated Creatinine Clearance: 50.9 mL/min (by C-G formula based on SCr of 1.08 mg/dL). Liver Function Tests: Recent Labs  Lab 01/02/21 1404  AST 24  ALT 10  ALKPHOS 62  BILITOT 0.7  PROT 5.8*  ALBUMIN 3.0*   No results for input(s): LIPASE, AMYLASE in the last 168 hours. No results for input(s): AMMONIA in the last 168 hours. Coagulation Profile: No results for input(s): INR, PROTIME in the last 168 hours. Cardiac Enzymes: No results for input(s):  CKTOTAL, CKMB, CKMBINDEX, TROPONINI in the last 168 hours. BNP (last 3 results) No results for input(s): PROBNP in the last 8760 hours. HbA1C: No results for input(s): HGBA1C in the last 72 hours. CBG: No results for input(s): GLUCAP in the last 168 hours. Lipid Profile: No results for input(s): CHOL, HDL, LDLCALC, TRIG, CHOLHDL, LDLDIRECT in the last 72 hours. Thyroid Function Tests: No results for input(s): TSH, T4TOTAL, FREET4, T3FREE, THYROIDAB in the last 72 hours. Anemia Panel: No results for input(s): VITAMINB12, FOLATE, FERRITIN, TIBC, IRON, RETICCTPCT in the last 72 hours. Sepsis Labs: Recent Labs  Lab 01/02/21 2322 01/03/21 0153  LATICACIDVEN 1.4  0.9    Recent Results (from the past 240 hour(s))  Resp Panel by RT-PCR (Flu A&B, Covid) Nasopharyngeal Swab     Status: None   Collection Time: 01/02/21  3:08 PM   Specimen: Nasopharyngeal Swab; Nasopharyngeal(NP) swabs in vial transport medium  Result Value Ref Range Status   SARS Coronavirus 2 by RT PCR NEGATIVE NEGATIVE Final    Comment: (NOTE) SARS-CoV-2 target nucleic acids are NOT DETECTED.  The SARS-CoV-2 RNA is generally detectable in upper respiratory specimens during the acute phase of infection. The lowest concentration of SARS-CoV-2 viral copies this assay can detect is 138 copies/mL. A negative result does not preclude SARS-Cov-2 infection and should not be used as the sole basis for treatment or other patient management decisions. A negative result may occur with  improper specimen collection/handling, submission of specimen other than nasopharyngeal swab, presence of viral mutation(s) within the areas targeted by this assay, and inadequate number of viral copies(<138 copies/mL). A negative result must be combined with clinical observations, patient history, and epidemiological information. The expected result is Negative.  Fact Sheet for Patients:  EntrepreneurPulse.com.au  Fact Sheet for Healthcare Providers:  IncredibleEmployment.be  This test is no t yet approved or cleared by the [place] FDA and  has been authorized for detection and/or diagnosis of SARS-CoV-2 by FDA under an Emergency Use Authorization (EUA). This EUA will remain  in effect (meaning this test can be used) for the duration of the COVID-19 declaration under Section 564(b)(1) of the Act, 21 U.S.C.section 360bbb-3(b)(1), unless the authorization is terminated  or revoked sooner.       Influenza A by PCR NEGATIVE NEGATIVE Final   Influenza B by PCR NEGATIVE NEGATIVE Final    Comment: (NOTE) The Xpert Xpress SARS-CoV-2/FLU/RSV plus assay is intended  as an aid in the diagnosis of influenza from Nasopharyngeal swab specimens and should not be used as a sole basis for treatment. Nasal washings and aspirates are unacceptable for Xpert Xpress SARS-CoV-2/FLU/RSV testing.  Fact Sheet for Patients: EntrepreneurPulse.com.au  Fact Sheet for Healthcare Providers: IncredibleEmployment.be  This test is not yet approved or cleared by the [place] FDA and has been authorized for detection and/or diagnosis of SARS-CoV-2 by FDA under an Emergency Use Authorization (EUA). This EUA will remain in effect (meaning this test can be used) for the duration of the COVID-19 declaration under Section 564(b)(1) of the Act, 21 U.S.C. section 360bbb-3(b)(1), unless the authorization is terminated or revoked.  Performed at [facility], [address], [place], [place] [address]          Radiology Studies: CT Chest Wo Contrast  Result Date: 01/02/2021 CLINICAL DATA:  Cough, lung nodules, generalized weakness, shortness of breath EXAM: CT CHEST WITHOUT CONTRAST TECHNIQUE: Multidetector CT imaging of the chest was performed following the standard protocol without IV contrast. Sagittal and coronal MPR  images reconstructed from axial data set. COMPARISON:  Chest radiograph 01/02/2021 FINDINGS: Cardiovascular: Atherosclerotic calcifications aorta, proximal great vessels and coronary arteries. Upper normal caliber ascending thoracic aorta 3.9 cm diameter. Mitral annular and aortic valvular calcifications. No pericardial effusion. RIGHT jugular Port-A-Cath with tip in SVC. Mediastinum/Nodes: Esophagus unremarkable. Base of cervical region normal appearance. Few scattered normal size mediastinal lymph nodes without definite thoracic adenopathy. Lungs/Pleura: Small BILATERAL pleural effusions. Patchy predominantly peribronchial infiltrates throughout both lungs involving all lobes consistent with multifocal pneumonia.  No pneumothorax. No discrete pulmonary mass. Upper Abdomen: Spleen appears enlarged, 13.6 x 8.9 cm image 145. Remaining visualized upper abdomen unremarkable. Musculoskeletal: No osseous abnormalities. Tiny subcutaneous nodule presternal 11 mm greatest diameter image 74 question epidermal inclusion cyst versus sebaceous cyst. IMPRESSION: Extensive BILATERAL patchy pulmonary infiltrates, predominantly perivascular, consistent with multifocal pneumonia. Radiographic follow-up until resolution recommended to exclude underlying pulmonary nodules. Small BILATERAL pleural effusions. Mild splenic enlargement. Scattered atherosclerotic calcifications including coronary arteries. Aortic Atherosclerosis (ICD10-I70.0). Electronically Signed   By: Lavonia Dana M.D.   On: 01/02/2021 15:55   DG Chest Portable 1 View  Result Date: 01/02/2021 CLINICAL DATA:  Cough and short of breath EXAM: PORTABLE CHEST 1 VIEW COMPARISON:  None. FINDINGS: Right-sided central venous port tip over the distal SVC. Widespread bilateral airspace opacities some of which are nodular in appearance. Possible small right effusion. Borderline cardiomegaly with aortic atherosclerosis. No pneumothorax. IMPRESSION: Widespread bilateral airspace opacities, some of which are nodular in appearance. Findings could be secondary to diffuse bilateral pneumonia, however given nodular appearance, the possibility of pulmonary nodules is also raised. Suggest chest CT for further evaluation. Electronically Signed   By: Donavan Foil M.D.   On: 01/02/2021 15:26        Scheduled Meds: . allopurinol  300 mg Oral Daily  . dextromethorphan-guaiFENesin  1 tablet Oral BID  . enoxaparin (LOVENOX) injection  40 mg Subcutaneous Q24H  . ferrous sulfate  325 mg Oral Daily  . latanoprost  1 drop Both Eyes QHS  . magnesium gluconate  500 mg Oral Daily  . tamsulosin  0.4 mg Oral Daily  . vitamin B-12  1,000 mcg Oral Daily   Continuous Infusions: . sodium chloride 100  mL/hr at 01/03/21 0519  . azithromycin Stopped (01/02/21 1904)  . cefTRIAXone (ROCEPHIN)  IV Stopped (01/02/21 1744)    Assessment & Plan:   Active Problems:   Multifocal pneumonia   1.  Multifocal pneumonia with subsequent acute respiratory failure with hypoxia.   The patient has subsequent sepsis as manifested by leukopenia and tachypnea. Minimally improved.  02 sat 87% on RA..>94% on 2 L Lactic acid nml Continue iv abx with rocephine and azithromycin IS Flutter valve F/u bcx Mucolytic mdi prn   2. Acute respiratory failure with hypoxia Due to multifocal pneumonia Treatment as above with IV antibiotics Supplemental with oxygen to keep O2 sat above 92% COVID-negative  3.  Sepsis-likely from multifocal pneumonia WBC lower at 1.3 Blood cultures pending Continue antibiotic treatments as above    4.  Pancytopenia with neutropenia and B-cell lymphoma. - The patient will be placed on reverse isolation and will follow CBC.. - Continues vitamin B12. 5/14-hematology consulted for further management WBC down to 1.3  5.hyponatremia- Na 127. Asx. Possibly from pna. Continue ivf Monitor levels  6.  BPH Continue Flomax  7. Gout. - Continue allopurinol.  8.  Glaucoma. - continue his ophthalmic gtt.   DVT prophylaxis: scd Code Status:full Family Communication: none at bedside  Status is:  Inpatient  Remains inpatient appropriate because:Inpatient level of care appropriate due to severity of illness   Dispo: The patient is from: Home              Anticipated d/c is to: Home              Patient currently is not medically stable to d/c.   Difficult to place patient No            LOS: 1 day   Time spent: 45 minutes with more than 50% on [place], MD [facility] Pager [phone]  If 7PM-7AM, please contact night-coverage 01/03/2021, 8:16 AM

## 2021-01-03 NOTE — Progress Notes (Signed)
TOC consult for PCP resources as patient needs new PCP in town. Provided list of PCP options to patient at bedside.  Harry Strong, [place]

## 2021-01-04 DIAGNOSIS — C8590 Non-Hodgkin lymphoma, unspecified, unspecified site: Secondary | ICD-10-CM

## 2021-01-04 LAB — BASIC METABOLIC PANEL
Anion gap: 7 (ref 5–15)
BUN: 17 mg/dL (ref 8–23)
CO2: 23 mmol/L (ref 22–32)
Calcium: 7.9 mg/dL — ABNORMAL LOW (ref 8.9–10.3)
Chloride: 101 mmol/L (ref 98–111)
Creatinine, Ser: 1.06 mg/dL (ref 0.61–1.24)
GFR, Estimated: >60 mL/min (ref >60)
Glucose, Bld: 116 mg/dL — ABNORMAL HIGH (ref 70–99)
Potassium: 3.9 mmol/L (ref 3.5–5.1)
Sodium: 131 mmol/L — ABNORMAL LOW (ref 135–145)

## 2021-01-04 LAB — CBC
HCT: 26.3 % — ABNORMAL LOW (ref 39.0–52.0)
Hemoglobin: 8.8 g/dL — ABNORMAL LOW (ref 13.0–17.0)
MCH: 27.1 pg (ref 26.0–34.0)
MCHC: 33.5 g/dL (ref 30.0–36.0)
MCV: 80.9 fL (ref 80.0–100.0)
Platelets: 86 10*3/uL — ABNORMAL LOW (ref 150–400)
RBC: 3.25 MIL/uL — ABNORMAL LOW (ref 4.22–5.81)
RDW: 14.7 % (ref 11.5–15.5)
WBC: 1 10*3/uL — CL (ref 4.0–10.5)
nRBC: 0 % (ref 0.0–0.2)

## 2021-01-04 MED ORDER — ENSURE ENLIVE PO LIQD
237.0000 mL | Freq: Two times a day (BID) | ORAL | Status: DC
  Administered 2021-01-04 – 2021-01-08 (×9): 237 mL via ORAL

## 2021-01-04 MED ORDER — PROSOURCE PLUS PO LIQD
30.0000 mL | Freq: Every day | ORAL | Status: DC
  Administered 2021-01-04 – 2021-01-08 (×5): 30 mL via ORAL
  Filled 2021-01-04 (×5): qty 30

## 2021-01-04 MED ORDER — AZITHROMYCIN 500 MG PO TABS
500.0000 mg | ORAL_TABLET | Freq: Every day | ORAL | Status: AC
  Administered 2021-01-04 – 2021-01-06 (×3): 500 mg via ORAL
  Filled 2021-01-04 (×3): qty 1

## 2021-01-04 MED ORDER — ORAL CARE MOUTH RINSE
15.0000 mL | Freq: Two times a day (BID) | OROMUCOSAL | Status: DC
  Administered 2021-01-04 – 2021-01-08 (×8): 15 mL via OROMUCOSAL

## 2021-01-04 NOTE — Progress Notes (Signed)
Patient refuses bed alarm at this time, patient educated about safety, will continue to monitor.

## 2021-01-04 NOTE — Progress Notes (Signed)
PHARMACIST - PHYSICIAN COMMUNICATION DR:   Kurtis Bushman CONCERNING: Antibiotic IV to Oral Route Change Policy  RECOMMENDATION: This patient is receiving azithromycin by the intravenous route.  Based on criteria approved by the Pharmacy and Therapeutics Committee, the antibiotic(s) is/are being converted to the equivalent oral dose form(s).   DESCRIPTION: These criteria include:  Patient being treated for a respiratory tract infection, urinary tract infection, cellulitis or clostridium difficile associated diarrhea if on metronidazole  The patient is not neutropenic and does not exhibit a GI malabsorption state  The patient is eating (either orally or via tube) and/or has been taking other orally administered medications for a least 24 hours  The patient is improving clinically and has a Tmax < 100.5  If you have questions about this conversion, please contact the [place], PharmD, BCPS Clinical Pharmacist 01/04/2021 12:10 PM

## 2021-01-04 NOTE — Progress Notes (Addendum)
PROGRESS NOTE    Harry Strong  [id number] DOB: Apr 02, 1935 DOA: 01/02/2021 PCP: Merryl Hacker, No    Brief Narrative:  Harry Strong is a 86 y.o. male with medical history significant for B-cell lymphoma, BPH, gout and pancytopenia, who presented to the ER with acute onset of worsening dyspnea with associated to cough productive of clear sputum without wheezing.  He has been having intermittent low-grade fever that was up to 99 over the last few days.  He denies any chills.  No nausea or vomiting or abdominal pain.  He denies any chest pain or palpitations.  No dysuria, degree of hematuria or flank pain. ED Course: When he came to the ER vital signs were within normal.  His pulse oximetry briefly dropped to 88% on room air.  Later respiratory rate was 27 then 25.  Labs revealed leukopenia of 1.7 with anemia of 9.9/28.6 and thrombocytopenia 118 with neutropenia with ANC of 1.4.  UA was unremarkable  cxr with b/l infiltrates. Being treated for PAN.  5/15-feels a little short of breath at times.  No chest pain or complaints.  Hematology at bedside.bp still on low side  Consultants:   hematology  Procedures:  CT chest: Extensive BILATERAL patchy pulmonary infiltrates, predominantly perivascular, consistent with multifocal pneumonia. Radiographic follow-up until resolution recommended to exclude underlying pulmonary nodules. Small BILATERAL pleural effusions. Mild splenic enlargement. Scattered atherosclerotic calcifications including coronary arteries. Aortic Atherosclerosis (ICD10-I70.0).  Antimicrobials:   Ceftriaxone and azithromycin 5/13>>>>   Subjective: No chest pain.  No congestion.  Objective: Vitals:   01/03/21 1715 01/03/21 2134 01/04/21 0556 01/04/21 0744  BP: (!) 112/58 111/60 (!) 109/51 109/60  Pulse: 74 67 64 67  Resp: 16 (!) 22 18 19   Temp: 99.2 F (37.3 C) 98.2 F (36.8 C) 98.6 F (37 C) 98.2 F (36.8 C)  TempSrc: Oral Oral    SpO2: 93% 96% 96% 96%   Weight:      Height:        Intake/Output Summary (Last 24 hours) at 01/04/2021 0758 Last data filed at 01/04/2021 0553 Gross per 24 hour  Intake 360 ml  Output 1550 ml  Net -1190 ml   Filed Weights   01/02/21 1352  Weight: 81.6 kg    Examination: Calm, comfortable Positive scattered Rales at bases no wheezing Regular S1-S2 harsh 3 out of 6 systolic ejection murmur at left sternal base no gallops Soft benign positive bowel sounds No edema Awake and alert, grossly intact Mood and affect appropriate in current setting    Data Reviewed: I have personally reviewed following labs and imaging studies  CBC: Recent Labs  Lab 01/02/21 1404 01/03/21 0153 01/04/21 0458  WBC 1.7* 1.3* 1.0*  NEUTROABS 1.4*  --   --   HGB 9.9* 9.4* 8.8*  HCT 28.6* 27.4* 26.3*  MCV 79.4* 80.4 80.9  PLT 118* 94* 86*   Basic Metabolic Panel: Recent Labs  Lab 01/02/21 1404 01/03/21 0153 01/04/21 0458  NA 127* 127* 131*  K 3.7 3.8 3.9  CL 97* 98 101  CO2 21* 22 23  GLUCOSE 129* 104* 116*  BUN 18 19 17   CREATININE 1.15 1.08 1.06  CALCIUM 8.4* 8.1* 7.9*   GFR: Estimated Creatinine Clearance: 51.9 mL/min (by C-G formula based on SCr of 1.06 mg/dL). Liver Function Tests: Recent Labs  Lab 01/02/21 1404  AST 24  ALT 10  ALKPHOS 62  BILITOT 0.7  PROT 5.8*  ALBUMIN 3.0*   No results for input(s): LIPASE, AMYLASE in the last  168 hours. No results for input(s): AMMONIA in the last 168 hours. Coagulation Profile: No results for input(s): INR, PROTIME in the last 168 hours. Cardiac Enzymes: No results for input(s): CKTOTAL, CKMB, CKMBINDEX, TROPONINI in the last 168 hours. BNP (last 3 results) No results for input(s): PROBNP in the last 8760 hours. HbA1C: No results for input(s): HGBA1C in the last 72 hours. CBG: No results for input(s): GLUCAP in the last 168 hours. Lipid Profile: No results for input(s): CHOL, HDL, LDLCALC, TRIG, CHOLHDL, LDLDIRECT in the last 72 hours. Thyroid  Function Tests: No results for input(s): TSH, T4TOTAL, FREET4, T3FREE, THYROIDAB in the last 72 hours. Anemia Panel: No results for input(s): VITAMINB12, FOLATE, FERRITIN, TIBC, IRON, RETICCTPCT in the last 72 hours. Sepsis Labs: Recent Labs  Lab 01/02/21 2322 01/03/21 0153  LATICACIDVEN 1.4 0.9    Recent Results (from the past 240 hour(s))  Resp Panel by RT-PCR (Flu A&B, Covid) Nasopharyngeal Swab     Status: None   Collection Time: 01/02/21  3:08 PM   Specimen: Nasopharyngeal Swab; Nasopharyngeal(NP) swabs in vial transport medium  Result Value Ref Range Status   SARS Coronavirus 2 by RT PCR NEGATIVE NEGATIVE Final    Comment: (NOTE) SARS-CoV-2 target nucleic acids are NOT DETECTED.  The SARS-CoV-2 RNA is generally detectable in upper respiratory specimens during the acute phase of infection. The lowest concentration of SARS-CoV-2 viral copies this assay can detect is 138 copies/mL. A negative result does not preclude SARS-Cov-2 infection and should not be used as the sole basis for treatment or other patient management decisions. A negative result may occur with  improper specimen collection/handling, submission of specimen other than nasopharyngeal swab, presence of viral mutation(s) within the areas targeted by this assay, and inadequate number of viral copies(<138 copies/mL). A negative result must be combined with clinical observations, patient history, and epidemiological information. The expected result is Negative.  Fact Sheet for Patients:  EntrepreneurPulse.com.au  Fact Sheet for Healthcare Providers:  IncredibleEmployment.be  This test is no t yet approved or cleared by the [place] FDA and  has been authorized for detection and/or diagnosis of SARS-CoV-2 by FDA under an Emergency Use Authorization (EUA). This EUA will remain  in effect (meaning this test can be used) for the duration of the COVID-19 declaration under  Section 564(b)(1) of the Act, 21 U.S.C.section 360bbb-3(b)(1), unless the authorization is terminated  or revoked sooner.       Influenza A by PCR NEGATIVE NEGATIVE Final   Influenza B by PCR NEGATIVE NEGATIVE Final    Comment: (NOTE) The Xpert Xpress SARS-CoV-2/FLU/RSV plus assay is intended as an aid in the diagnosis of influenza from Nasopharyngeal swab specimens and should not be used as a sole basis for treatment. Nasal washings and aspirates are unacceptable for Xpert Xpress SARS-CoV-2/FLU/RSV testing.  Fact Sheet for Patients: EntrepreneurPulse.com.au  Fact Sheet for Healthcare Providers: IncredibleEmployment.be  This test is not yet approved or cleared by the [place] FDA and has been authorized for detection and/or diagnosis of SARS-CoV-2 by FDA under an Emergency Use Authorization (EUA). This EUA will remain in effect (meaning this test can be used) for the duration of the COVID-19 declaration under Section 564(b)(1) of the Act, 21 U.S.C. section 360bbb-3(b)(1), unless the authorization is terminated or revoked.  Performed at [facility], [address] [place], [place] [address]          Radiology Studies: CT Chest Wo Contrast  Result Date: 01/02/2021 CLINICAL DATA:  Cough, lung  nodules, generalized weakness, shortness of breath EXAM: CT CHEST WITHOUT CONTRAST TECHNIQUE: Multidetector CT imaging of the chest was performed following the standard protocol without IV contrast. Sagittal and coronal MPR images reconstructed from axial data set. COMPARISON:  Chest radiograph 01/02/2021 FINDINGS: Cardiovascular: Atherosclerotic calcifications aorta, proximal great vessels and coronary arteries. Upper normal caliber ascending thoracic aorta 3.9 cm diameter. Mitral annular and aortic valvular calcifications. No pericardial effusion. RIGHT jugular Port-A-Cath with tip in SVC. Mediastinum/Nodes: Esophagus unremarkable. Base  of cervical region normal appearance. Few scattered normal size mediastinal lymph nodes without definite thoracic adenopathy. Lungs/Pleura: Small BILATERAL pleural effusions. Patchy predominantly peribronchial infiltrates throughout both lungs involving all lobes consistent with multifocal pneumonia. No pneumothorax. No discrete pulmonary mass. Upper Abdomen: Spleen appears enlarged, 13.6 x 8.9 cm image 145. Remaining visualized upper abdomen unremarkable. Musculoskeletal: No osseous abnormalities. Tiny subcutaneous nodule presternal 11 mm greatest diameter image 74 question epidermal inclusion cyst versus sebaceous cyst. IMPRESSION: Extensive BILATERAL patchy pulmonary infiltrates, predominantly perivascular, consistent with multifocal pneumonia. Radiographic follow-up until resolution recommended to exclude underlying pulmonary nodules. Small BILATERAL pleural effusions. Mild splenic enlargement. Scattered atherosclerotic calcifications including coronary arteries. Aortic Atherosclerosis (ICD10-I70.0). Electronically Signed   By: Lavonia Dana M.D.   On: 01/02/2021 15:55   DG Chest Portable 1 View  Result Date: 01/02/2021 CLINICAL DATA:  Cough and short of breath EXAM: PORTABLE CHEST 1 VIEW COMPARISON:  None. FINDINGS: Right-sided central venous port tip over the distal SVC. Widespread bilateral airspace opacities some of which are nodular in appearance. Possible small right effusion. Borderline cardiomegaly with aortic atherosclerosis. No pneumothorax. IMPRESSION: Widespread bilateral airspace opacities, some of which are nodular in appearance. Findings could be secondary to diffuse bilateral pneumonia, however given nodular appearance, the possibility of pulmonary nodules is also raised. Suggest chest CT for further evaluation. Electronically Signed   By: Donavan Foil M.D.   On: 01/02/2021 15:26        Scheduled Meds: . allopurinol  300 mg Oral Daily  . dextromethorphan-guaiFENesin  1 tablet Oral BID   . enoxaparin (LOVENOX) injection  40 mg Subcutaneous Q24H  . ferrous sulfate  325 mg Oral Daily  . latanoprost  1 drop Both Eyes QHS  . magnesium gluconate  500 mg Oral Daily  . tamsulosin  0.4 mg Oral Daily  . vitamin B-12  1,000 mcg Oral Daily   Continuous Infusions: . sodium chloride 75 mL/hr at 01/03/21 1924  . azithromycin Stopped (01/04/21 0435)  . cefTRIAXone (ROCEPHIN)  IV Stopped (01/04/21 0352)    Assessment & Plan:   Active Problems:   Multifocal pneumonia   1.  Multifocal pneumonia with subsequent acute respiratory failure with hypoxia.   The patient has subsequent sepsis as manifested by leukopenia and tachypnea due to PNA. 02 sat 87% on RA..>94% on 2 L Lactic acid nml 5/15-Slowly improving continue Continue IV Rocephin and azithromycin I-S, flutter valve Mucolytic's MDI as needed bcx pending?     2. Acute respiratory failure with hypoxia Due to multifocal pneumonia 5/15-on 2 L nasal cannula and satting 96% at rest.  Will need to do ambulatory O2 evaluation prior to discharge  Continue IV antibiotics as above  COVID-negative     3.  Sepsis-likely from multifocal pneumonia WBC continues to drop, today 1.0 Blood cultures pending Continue IV antibiotics Continue IV fluids as bp on low side      4.  Pancytopenia with neutropenia and B-cell lymphoma. - The patient will be placed on reverse isolation and will follow CBC.. -  Continues vitamin B12. 5/15-WBC dropped to 1.0  Hematology consulted will follow up on their input    5.hyponatremia- Na 127. Asx. Possibly 2/2 pna and dehydration Improving with ivf   6.  BPH Continue Flomax  7. Gout. - Continue allopurinol.  8.  Glaucoma. - continue his ophthalmic gtt.   DVT prophylaxis: scd Code Status:full Family Communication: none at bedside  Status is: Inpatient  Remains inpatient appropriate because:Inpatient level of care appropriate due to severity of illness   Dispo: The  patient is from: Home              Anticipated d/c is to: Home              Patient currently is not medically stable to d/c.   Difficult to place patient No            LOS: 2 days   Time spent: 35 minutes with more than 50% on [place], MD [facility] Pager [phone]  If 7PM-7AM, please contact night-coverage 01/04/2021, 7:58 AM

## 2021-01-04 NOTE — Plan of Care (Signed)
Notified on call NP that WBC has dropped to 1.0; yesterday it was 1.3; Hgb 8.8. Platelets dropped to 86; yesterday it ws 94.

## 2021-01-04 NOTE — Progress Notes (Signed)
Initial Nutrition Assessment  RD working remotely.  DOCUMENTATION CODES:   Not applicable  INTERVENTION:  - will order Ensure Enlive BID, each supplement provides 350 kcal and 20 grams of protein. - will order 30 ml Prosource Plus once/day, each supplement provides 100 kcal and 15 grams protein.  - complete NFPE when feasible.    NUTRITION DIAGNOSIS:   Increased nutrient needs related to acute illness,chronic illness as evidenced by estimated needs.  GOAL:   Patient will meet greater than or equal to 90% of their needs  MONITOR:   PO intake,Supplement acceptance,Labs,Weight trends  REASON FOR ASSESSMENT:   Malnutrition Screening Tool  ASSESSMENT:   86 y.o. male with medical history of B-cell lymphoma, BPH, gout, and pancytopenia. He presented to the ED due to acute onset of worsening dyspnea, productive cough, and intermittent low-grade fever. Chest x-ray showed widespread bilateral airspace opacities, some of which are nodular thought to be 2/2 diffuse bilateral PNA.  Patient consumed 100% of breakfast and 50% of lunch yesterday (total of 834 kcal and 42 grams protein).   He has not been seen by a [place] RD at any time in the past.  Weight on 5/13 was documented as 180 lb, which appears to be a stated weight. No other weight hx information is available in the chart.    Labs reviewed; Na: 131 mmol/l, Ca: 7.9 mg/dl. Medications reviewed; 325 mg ferrous sulfate/day, 500 mg magonate/day, 1000 mcg oral cyanocobalamin/day. IVF; NS @ 75 ml/hr.      NUTRITION - FOCUSED PHYSICAL EXAM:  unable to complete at this time.   Diet Order:   Diet Order            Diet Heart Room service appropriate? Yes; Fluid consistency: Thin  Diet effective now                 EDUCATION NEEDS:   No education needs have been identified at this time  Skin:  Skin Assessment: Reviewed RN Assessment  Last BM:  5/14 (indicates per patient report)  Height:   Ht Readings from  Last 1 Encounters:  01/02/21 5\' 9"  (1.753 m)    Weight:   Wt Readings from Last 1 Encounters:  01/02/21 81.6 kg    Estimated Nutritional Needs:  Kcal:  1650-1850 kcal Protein:  70-80 grams Fluid:  >/= 1.7 L/day      Jarome Matin, MS, RD, LDN, CNSC Inpatient Clinical Dietitian RD pager # available in [facility]  After hours/weekend pager # available in [facility]

## 2021-01-04 NOTE — Consult Note (Signed)
[facility]  [phone] [phone]  ID: Burney Bouyer OB: 05/08/1935  MR#: [id number]  CSN#:[id number]  Patient Care Team: Pcp, No as PCP - General System, Provider Not In as PCP - Hematology/Oncology (Hematology and Oncology)  CHIEF COMPLAINT: Pneumonia, pancytopenia and B-cell lymphoma  INTERVAL HISTORY: Mr. Carlson is an 86-year-old male with past medical history significant for BPH, gout and B-cell lymphoma who is treated in [place] who is currently not on active treatment who presented to the emergency room for weakness, malaise and shortness of breath and found to have multifocal pneumonia.  States he met with his oncology doctor at the end of April and was told he had good blood counts.  Work-up included labs and imaging which showed bilateral patchy pulmonary infiltrates and small bilateral pleural effusion.  Mild splenic enlargement.  Labs show mild dehydration with significant hyponatremia and pancytopenia.  He was admitted for IV antibiotics, electrolyte replacement and observation.  Today he is feeling better. Still has some shortness of breath. He is eating well. States he is unable to rest due to alarms and Iv pumps.    REVIEW OF SYSTEMS:   Review of Systems  Constitutional: Negative.  Negative for chills, fever, malaise/fatigue and weight loss.  HENT: Negative for congestion, ear pain and tinnitus.   Eyes: Negative.  Negative for blurred vision and double vision.  Respiratory: Positive for shortness of breath. Negative for cough and sputum production.   Cardiovascular: Negative.  Negative for chest pain, palpitations and leg swelling.  Gastrointestinal: Negative.  Negative for abdominal pain, constipation, diarrhea, nausea and vomiting.  Genitourinary: Negative for dysuria, frequency and urgency.  Musculoskeletal: Negative for back pain and falls.  Skin: Negative.  Negative for rash.  Neurological: Negative.  Negative for weakness  and headaches.  Endo/Heme/Allergies: Negative.  Does not bruise/bleed easily.  Psychiatric/Behavioral: Negative.  Negative for depression. The patient is not nervous/anxious and does not have insomnia.     As per HPI. Otherwise, a complete review of systems is negative.  PAST MEDICAL HISTORY: History reviewed. No pertinent past medical history.  PAST SURGICAL HISTORY: None  FAMILY HISTORY: History reviewed. No pertinent family history.  ADVANCED DIRECTIVES (Y/N):  @ADVDIR@  HEALTH MAINTENANCE: Social History   Tobacco Use  . Smoking status: Never Smoker  Substance Use Topics  . Alcohol use: Not Currently  . Drug use: Never     Colonoscopy:  PAP:  Bone density:  Lipid panel:  Allergies  Allergen Reactions  . Aspirin Swelling  . Tylenol [Acetaminophen] Other (See Comments)    Red face, tongue, and lips    Current Facility-Administered Medications  Medication Dose Route Frequency Provider Last Rate Last Admin  . 0.9 %  sodium chloride infusion   Intravenous Continuous Amery, Sahar, MD 75 mL/hr at 01/03/21 1924 New Bag at 01/03/21 1924  . allopurinol (ZYLOPRIM) tablet 300 mg  300 mg Oral Daily Mansy, Jan A, MD   300 mg at 01/03/21 0959  . azithromycin (ZITHROMAX) 500 mg in sodium chloride 0.9 % 250 mL IVPB  500 mg Intravenous Q24H Mansy, Jan A, MD   Stopped at 01/04/21 0435  . cefTRIAXone (ROCEPHIN) 2 g in sodium chloride 0.9 % 100 mL IVPB  2 g Intravenous Q24H Mansy, Jan A, MD   Stopped at 01/04/21 0352  . dextromethorphan-guaiFENesin (MUCINEX DM) 30-600 MG per 12 hr tablet 1 tablet  1 tablet Oral BID Mansy, Jan A, MD   1 tablet at 01/03/21 2302  . dextromethorphan-guaiFENesin (  MUCINEX DM) 30-600 MG per 12 hr tablet 1 tablet  1 tablet Oral BID PRN Mansy, Jan A, MD   1 tablet at 01/02/21 1711  . enoxaparin (LOVENOX) injection 40 mg  40 mg Subcutaneous Q24H Mansy, Jan A, MD   40 mg at 01/02/21 1712  . ferrous sulfate tablet 325 mg  325 mg Oral Daily Mansy, Jan A, MD   325  mg at 01/03/21 0959  . latanoprost (XALATAN) 0.005 % ophthalmic solution 1 drop  1 drop Both Eyes QHS Mansy, Jan A, MD   1 drop at 01/03/21 2303  . magnesium gluconate (MAGONATE) tablet 500 mg  500 mg Oral Daily Mansy, Jan A, MD   500 mg at 01/03/21 1000  . magnesium hydroxide (MILK OF MAGNESIA) suspension 30 mL  30 mL Oral Daily PRN Mansy, Jan A, MD      . ondansetron (ZOFRAN) tablet 4 mg  4 mg Oral Q6H PRN Mansy, Jan A, MD       Or  . ondansetron (ZOFRAN) injection 4 mg  4 mg Intravenous Q6H PRN Mansy, Jan A, MD      . tamsulosin (FLOMAX) capsule 0.4 mg  0.4 mg Oral Daily Mansy, Jan A, MD   0.4 mg at 01/03/21 0959  . traZODone (DESYREL) tablet 25 mg  25 mg Oral QHS PRN Mansy, Jan A, MD   25 mg at 01/02/21 2205  . vitamin B-12 (CYANOCOBALAMIN) tablet 1,000 mcg  1,000 mcg Oral Daily Mansy, Jan A, MD   1,000 mcg at 01/03/21 0959    OBJECTIVE: Vitals:   01/04/21 0556 01/04/21 0744  BP: (!) 109/51 109/60  Pulse: 64 67  Resp: 18 19  Temp: 98.6 F (37 C) 98.2 F (36.8 C)  SpO2: 96% 96%     Body mass index is 26.58 kg/m.    ECOG FS:2 - Symptomatic, <50% confined to bed  Physical Exam Constitutional:      Appearance: Normal appearance.  HENT:     Head: Normocephalic and atraumatic.  Eyes:     Pupils: Pupils are equal, round, and reactive to light.  Cardiovascular:     Rate and Rhythm: Normal rate and regular rhythm.     Heart sounds: Normal heart sounds. No murmur heard.   Pulmonary:     Effort: Pulmonary effort is normal.     Breath sounds: Decreased breath sounds present. No wheezing.  Abdominal:     General: Bowel sounds are normal. There is no distension.     Palpations: Abdomen is soft.     Tenderness: There is no abdominal tenderness.  Musculoskeletal:        General: Normal range of motion.     Cervical back: Normal range of motion.  Skin:    General: Skin is warm and dry.     Findings: No rash.  Neurological:     Mental Status: He is alert and oriented to person,  place, and time.  Psychiatric:        Judgment: Judgment normal.       LAB RESULTS:  Lab Results  Component Value Date   NA 131 (L) 01/04/2021   K 3.9 01/04/2021   CL 101 01/04/2021   CO2 23 01/04/2021   GLUCOSE 116 (H) 01/04/2021   BUN 17 01/04/2021   CREATININE 1.06 01/04/2021   CALCIUM 7.9 (L) 01/04/2021   PROT 5.8 (L) 01/02/2021   ALBUMIN 3.0 (L) 01/02/2021   AST 24 01/02/2021   ALT 10 01/02/2021   ALKPHOS   62 01/02/2021   BILITOT 0.7 01/02/2021   GFRNONAA >60 01/04/2021    Lab Results  Component Value Date   WBC 1.0 (LL) 01/04/2021   NEUTROABS 1.4 (L) 01/02/2021   HGB 8.8 (L) 01/04/2021   HCT 26.3 (L) 01/04/2021   MCV 80.9 01/04/2021   PLT 86 (L) 01/04/2021     STUDIES: CT Chest Wo Contrast  Result Date: 01/02/2021 CLINICAL DATA:  Cough, lung nodules, generalized weakness, shortness of breath EXAM: CT CHEST WITHOUT CONTRAST TECHNIQUE: Multidetector CT imaging of the chest was performed following the standard protocol without IV contrast. Sagittal and coronal MPR images reconstructed from axial data set. COMPARISON:  Chest radiograph 01/02/2021 FINDINGS: Cardiovascular: Atherosclerotic calcifications aorta, proximal great vessels and coronary arteries. Upper normal caliber ascending thoracic aorta 3.9 cm diameter. Mitral annular and aortic valvular calcifications. No pericardial effusion. RIGHT jugular Port-A-Cath with tip in SVC. Mediastinum/Nodes: Esophagus unremarkable. Base of cervical region normal appearance. Few scattered normal size mediastinal lymph nodes without definite thoracic adenopathy. Lungs/Pleura: Small BILATERAL pleural effusions. Patchy predominantly peribronchial infiltrates throughout both lungs involving all lobes consistent with multifocal pneumonia. No pneumothorax. No discrete pulmonary mass. Upper Abdomen: Spleen appears enlarged, 13.6 x 8.9 cm image 145. Remaining visualized upper abdomen unremarkable. Musculoskeletal: No osseous  abnormalities. Tiny subcutaneous nodule presternal 11 mm greatest diameter image 74 question epidermal inclusion cyst versus sebaceous cyst. IMPRESSION: Extensive BILATERAL patchy pulmonary infiltrates, predominantly perivascular, consistent with multifocal pneumonia. Radiographic follow-up until resolution recommended to exclude underlying pulmonary nodules. Small BILATERAL pleural effusions. Mild splenic enlargement. Scattered atherosclerotic calcifications including coronary arteries. Aortic Atherosclerosis (ICD10-I70.0). Electronically Signed   By: Mark  Boles M.D.   On: 01/02/2021 15:55   DG Chest Portable 1 View  Result Date: 01/02/2021 CLINICAL DATA:  Cough and short of breath EXAM: PORTABLE CHEST 1 VIEW COMPARISON:  None. FINDINGS: Right-sided central venous port tip over the distal SVC. Widespread bilateral airspace opacities some of which are nodular in appearance. Possible small right effusion. Borderline cardiomegaly with aortic atherosclerosis. No pneumothorax. IMPRESSION: Widespread bilateral airspace opacities, some of which are nodular in appearance. Findings could be secondary to diffuse bilateral pneumonia, however given nodular appearance, the possibility of pulmonary nodules is also raised. Suggest chest CT for further evaluation. Electronically Signed   By: Kim  Fujinaga M.D.   On: 01/02/2021 15:26    ASSESSMENT: Mr. Carlson is an 86-year-old male who presents with multifocal pneumonia, history of B-cell lymphoma with pancytopenia.  PLAN:   B-cell lymphoma: -Treated in [place]-last treatment approximately 3 years ago -Not on active treatment. -States he was seen by his oncologist at the end of April 2022 and noted low white counts. -Plan is for 6-month labs with follow-up.  Pancytopenia: -Secondary to viral infection, enlarged spleen and disease. -On admission, white blood cell count 1700 with ANC of 1400.  Platelet count was 118,000 and a slightly decreased over the course of  his stay.  Hemoglobin 9.9. -As of today his white blood cell count has trended down and is 1.0, hemoglobin 8.8 and platelet count is 86,000. -Overall he is doing well.  He is afebrile and vital signs are stable.  Multifocal pneumonia: -Currently on IV antibiotics. -Appears to be improving  Plan: -Continue to monitor labs daily. -Hold Granix. -Spoke with Dr. Finnegan and Granix would interfere with bone marrow biopsy results should he have a recurrence of his lymphoma. Without medical records from his prior oncology treatments from [place], would hold off unless patient is symptomatic (Febrile, tachycardia, hypotensive).  -  Recommend blood transfusion for hemoglobin less than 8 and platelets less than 20,000. -Anticipate improvement with IV antibiotics.    Patient expressed understanding and was in agreement with this plan. He also understands that He can call clinic at any time with any questions, concerns, or complaints.   Cancer Staging No matching staging information was found for the patient.  Jennifer E Burns, NP   01/04/2021 9:08 AM     

## 2021-01-05 LAB — CBC
HCT: 28.5 % — ABNORMAL LOW (ref 39.0–52.0)
Hemoglobin: 9.8 g/dL — ABNORMAL LOW (ref 13.0–17.0)
MCH: 27.9 pg (ref 26.0–34.0)
MCHC: 34.4 g/dL (ref 30.0–36.0)
MCV: 81.2 fL (ref 80.0–100.0)
Platelets: 121 10*3/uL — ABNORMAL LOW (ref 150–400)
RBC: 3.51 MIL/uL — ABNORMAL LOW (ref 4.22–5.81)
RDW: 14.6 % (ref 11.5–15.5)
WBC: 1.5 10*3/uL — ABNORMAL LOW (ref 4.0–10.5)
nRBC: 0 % (ref 0.0–0.2)

## 2021-01-05 LAB — BASIC METABOLIC PANEL
Anion gap: 8 (ref 5–15)
BUN: 16 mg/dL (ref 8–23)
CO2: 23 mmol/L (ref 22–32)
Calcium: 8.2 mg/dL — ABNORMAL LOW (ref 8.9–10.3)
Chloride: 98 mmol/L (ref 98–111)
Creatinine, Ser: 1.07 mg/dL (ref 0.61–1.24)
GFR, Estimated: >60 mL/min (ref >60)
Glucose, Bld: 216 mg/dL — ABNORMAL HIGH (ref 70–99)
Potassium: 3.8 mmol/L (ref 3.5–5.1)
Sodium: 129 mmol/L — ABNORMAL LOW (ref 135–145)

## 2021-01-05 MED ORDER — ALBUTEROL SULFATE HFA 108 (90 BASE) MCG/ACT IN AERS
2.0000 | INHALATION_SPRAY | Freq: Four times a day (QID) | RESPIRATORY_TRACT | Status: DC | PRN
  Filled 2021-01-05: qty 6.7

## 2021-01-05 MED ORDER — MIDODRINE HCL 5 MG PO TABS
2.5000 mg | ORAL_TABLET | Freq: Three times a day (TID) | ORAL | Status: DC
  Administered 2021-01-05 – 2021-01-08 (×9): 2.5 mg via ORAL
  Filled 2021-01-05 (×9): qty 1

## 2021-01-05 NOTE — Progress Notes (Signed)
PROGRESS NOTE    Harry Strong  MRN:[id number] DOB: 04/26/1935 DOA: 01/02/2021 PCP: Pcp, No    Brief Narrative:  Harry Strong is a 86 y.o. male with medical history significant for B-cell lymphoma, BPH, gout and pancytopenia, who presented to the ER with acute onset of worsening dyspnea with associated to cough productive of clear sputum without wheezing.  He has been having intermittent low-grade fever that was up to 99 over the last few days.  He denies any chills.  No nausea or vomiting or abdominal pain.  He denies any chest pain or palpitations.  No dysuria, degree of hematuria or flank pain. ED Course: When he came to the ER vital signs were within normal.  His pulse oximetry briefly dropped to 88% on room air.  Later respiratory rate was 27 then 25.  Labs revealed leukopenia of 1.7 with anemia of 9.9/28.6 and thrombocytopenia 118 with neutropenia with ANC of 1.4.  UA was unremarkable  cxr with b/l infiltrates. Being treated for PAN.  5/16- feeling a little better, but states still with sob. No cp. No other complaints.  Consultants:   hematology  Procedures:  CT chest: Extensive BILATERAL patchy pulmonary infiltrates, predominantly perivascular, consistent with multifocal pneumonia. Radiographic follow-up until resolution recommended to exclude underlying pulmonary nodules. Small BILATERAL pleural effusions. Mild splenic enlargement. Scattered atherosclerotic calcifications including coronary arteries. Aortic Atherosclerosis (ICD10-I70.0).  Antimicrobials:   Ceftriaxone and azithromycin 5/13>>>>   Subjective: Still with sob, with minimal improvement. Not at baseline  Objective: Vitals:   01/04/21 1711 01/04/21 2003 01/05/21 0439 01/05/21 0749  BP: (!) 108/54 (!) 118/57 (!) 122/51 108/78  Pulse: 71 74 76 70  Resp: 16 18 18 17  Temp: 99.8 F (37.7 C) 99.9 F (37.7 C) 99.1 F (37.3 C) 97.9 F (36.6 C)  TempSrc:  Oral Oral   SpO2: 96% 96% 97% 96%   Weight:      Height:        Intake/Output Summary (Last 24 hours) at 01/05/2021 0754 Last data filed at 01/05/2021 0708 Gross per 24 hour  Intake 600 ml  Output 1350 ml  Net -750 ml   Filed Weights   01/02/21 1352  Weight: 81.6 kg    Examination: NAD, calm Scattered crackles at bases , forced end expiratory wheeze Regular s1/s2 no gallop Soft benign positive bowel sounds No edema Awake and alert grossly intact Mood and affect appropriate in current setting    Data Reviewed: I have personally reviewed following labs and imaging studies  CBC: Recent Labs  Lab 01/02/21 1404 01/03/21 0153 01/04/21 0458  WBC 1.7* 1.3* 1.0*  NEUTROABS 1.4*  --   --   HGB 9.9* 9.4* 8.8*  HCT 28.6* 27.4* 26.3*  MCV 79.4* 80.4 80.9  PLT 118* 94* 86*   Basic Metabolic Panel: Recent Labs  Lab 01/02/21 1404 01/03/21 0153 01/04/21 0458  NA 127* 127* 131*  K 3.7 3.8 3.9  CL 97* 98 101  CO2 21* 22 23  GLUCOSE 129* 104* 116*  BUN 18 19 17  CREATININE 1.15 1.08 1.06  CALCIUM 8.4* 8.1* 7.9*   GFR: Estimated Creatinine Clearance: 51.9 mL/min (by C-G formula based on SCr of 1.06 mg/dL). Liver Function Tests: Recent Labs  Lab 01/02/21 1404  AST 24  ALT 10  ALKPHOS 62  BILITOT 0.7  PROT 5.8*  ALBUMIN 3.0*   No results for input(s): LIPASE, AMYLASE in the last 168 hours. No results for input(s): AMMONIA in the last 168 hours. Coagulation Profile:   No results for input(s): INR, PROTIME in the last 168 hours. Cardiac Enzymes: No results for input(s): CKTOTAL, CKMB, CKMBINDEX, TROPONINI in the last 168 hours. BNP (last 3 results) No results for input(s): PROBNP in the last 8760 hours. HbA1C: No results for input(s): HGBA1C in the last 72 hours. CBG: No results for input(s): GLUCAP in the last 168 hours. Lipid Profile: No results for input(s): CHOL, HDL, LDLCALC, TRIG, CHOLHDL, LDLDIRECT in the last 72 hours. Thyroid Function Tests: No results for input(s): TSH, T4TOTAL,  FREET4, T3FREE, THYROIDAB in the last 72 hours. Anemia Panel: No results for input(s): VITAMINB12, FOLATE, FERRITIN, TIBC, IRON, RETICCTPCT in the last 72 hours. Sepsis Labs: Recent Labs  Lab 01/02/21 2322 01/03/21 0153  LATICACIDVEN 1.4 0.9    Recent Results (from the past 240 hour(s))  Resp Panel by RT-PCR (Flu A&B, Covid) Nasopharyngeal Swab     Status: None   Collection Time: 01/02/21  3:08 PM   Specimen: Nasopharyngeal Swab; Nasopharyngeal(NP) swabs in vial transport medium  Result Value Ref Range Status   SARS Coronavirus 2 by RT PCR NEGATIVE NEGATIVE Final    Comment: (NOTE) SARS-CoV-2 target nucleic acids are NOT DETECTED.  The SARS-CoV-2 RNA is generally detectable in upper respiratory specimens during the acute phase of infection. The lowest concentration of SARS-CoV-2 viral copies this assay can detect is 138 copies/mL. A negative result does not preclude SARS-Cov-2 infection and should not be used as the sole basis for treatment or other patient management decisions. A negative result may occur with  improper specimen collection/handling, submission of specimen other than nasopharyngeal swab, presence of viral mutation(s) within the areas targeted by this assay, and inadequate number of viral copies(<138 copies/mL). A negative result must be combined with clinical observations, patient history, and epidemiological information. The expected result is Negative.  Fact Sheet for Patients:  EntrepreneurPulse.com.au  Fact Sheet for Healthcare Providers:  IncredibleEmployment.be  This test is no t yet approved or cleared by the [place] FDA and  has been authorized for detection and/or diagnosis of SARS-CoV-2 by FDA under an Emergency Use Authorization (EUA). This EUA will remain  in effect (meaning this test can be used) for the duration of the COVID-19 declaration under Section 564(b)(1) of the Act, 21 U.S.C.section  360bbb-3(b)(1), unless the authorization is terminated  or revoked sooner.       Influenza A by PCR NEGATIVE NEGATIVE Final   Influenza B by PCR NEGATIVE NEGATIVE Final    Comment: (NOTE) The Xpert Xpress SARS-CoV-2/FLU/RSV plus assay is intended as an aid in the diagnosis of influenza from Nasopharyngeal swab specimens and should not be used as a sole basis for treatment. Nasal washings and aspirates are unacceptable for Xpert Xpress SARS-CoV-2/FLU/RSV testing.  Fact Sheet for Patients: EntrepreneurPulse.com.au  Fact Sheet for Healthcare Providers: IncredibleEmployment.be  This test is not yet approved or cleared by the [place] FDA and has been authorized for detection and/or diagnosis of SARS-CoV-2 by FDA under an Emergency Use Authorization (EUA). This EUA will remain in effect (meaning this test can be used) for the duration of the COVID-19 declaration under Section 564(b)(1) of the Act, 21 U.S.C. section 360bbb-3(b)(1), unless the authorization is terminated or revoked.  Performed at [facility] [address], [place], [place] [address]          Radiology Studies: No results found.      Scheduled Meds: . (feeding supplement) PROSource Plus  30 mL Oral Daily  . allopurinol  300 mg Oral  Daily  . azithromycin  500 mg Oral Daily  . dextromethorphan-guaiFENesin  1 tablet Oral BID  . enoxaparin (LOVENOX) injection  40 mg Subcutaneous Q24H  . feeding supplement  237 mL Oral BID BM  . ferrous sulfate  325 mg Oral Daily  . latanoprost  1 drop Both Eyes QHS  . magnesium gluconate  500 mg Oral Daily  . mouth rinse  15 mL Mouth Rinse BID  . tamsulosin  0.4 mg Oral Daily  . vitamin B-12  1,000 mcg Oral Daily   Continuous Infusions: . sodium chloride 75 mL/hr at 01/04/21 2256  . cefTRIAXone (ROCEPHIN)  IV 2 g (01/04/21 1740)    Assessment & Plan:   Active Problems:   Multifocal pneumonia   1.  Multifocal  pneumonia with subsequent acute respiratory failure with hypoxia.   The patient has subsequent sepsis as manifested by leukopenia and tachypnea due to PNA.  02 sat 87% on RA..>94% on 2 L Lactic acid nml 5/16- still not at baseline and expriencing sob Will continue rocephin, azithromycin IS , flutter valve5/15-Slowly improving continue Continue IV Rocephin and azithromycin I-S, flutter valve Mucolytics, albuterol mdi covid negative     2. Acute respiratory failure with hypoxia Due to multifocal pneumonia 5//16- still symptomatic. Continue 02 support keeping 02 sat >92% Iv abx as above     3.  Sepsis- due to PNA WBC up at 1.5 today  No cx  Continue iv abx     4.  Pancytopenia with neutropenia and B-cell lymphoma. - The patient will be placed on reverse isolation and will follow CBC.. - Continues vitamin B12. 5/16- wbc up at 1.5 Heme's input was appreciated-hold  Granix as it can interfere with bone marrow biopsy results should he have recurrence of his lymphoma. Transfuse for hemoglobin less than 8 and platelets less than 20,000 They anticipate improvement with IV antibiotics Platelets are improving today, at 121.   5.hyponatremia-  asx Possibly 2/2 pna and dehydration..>improved, but down mildly at 129 today. Will dc ivf. monitor  6.  BPH Continue Flomax  7. Gout. - Continue allopurinol.  8.  Glaucoma. - continue his ophthalmic gtt.   DVT prophylaxis: scd Code Status:full Family Communication: none at bedside  Status is: Inpatient  Remains inpatient appropriate because:Inpatient level of care appropriate due to severity of illness   Dispo: The patient is from: Home              Anticipated d/c is to: Home              Patient currently is not medically stable to d/c.   Difficult to place patient No            LOS: 3 days   Time spent: 35 minutes with more than 50% on COC    Sahar Amery, MD [facility] Pager [phone]  [phone]  If 7PM-7AM, please contact night-coverage 01/05/2021, 7:54 AM  

## 2021-01-05 NOTE — Progress Notes (Signed)
[facility]  Telephone:([phone]) [phone] Fax:(336) [phone]  ID: [facility] OB: August 31, 1934  MR#: [id number]  AOZ#:[id number]  Patient Care Team: Pcp, No as PCP - General System, Provider Not In as PCP - Hematology/Oncology (Hematology and Oncology)  CHIEF COMPLAINT: Pneumonia, pancytopenia and B-cell lymphoma  INTERVAL HISTORY: Mr. Harry Strong is an 86 year old male with past medical history significant for BPH, gout and B-cell lymphoma who is treated in [place] who is currently not on active treatment who presented to the emergency room for weakness, malaise and shortness of breath and found to have multifocal pneumonia.  States he met with his oncology doctor at the end of April and was told he had good blood counts.  Work-up included labs and imaging which showed bilateral patchy pulmonary infiltrates and small bilateral pleural effusion.  Mild splenic enlargement.  Labs show mild dehydration with significant hyponatremia and pancytopenia.  He was admitted for IV antibiotics, electrolyte replacement and observation.  Patient states he was previously diagnosed 8 years ago and treated last 3 years ago with rituximab for about 1 year at [facility]  He is currently on every 26-monthsurveillance visits.  He was told that his cancer is not active at this time.   [place]  [place]    States each day he is feeling better. Shortness of breath has improved.  He admits to a poor appetite but otherwise is feeling stable.  Denies chills or fever.  He feels weak and tired.    REVIEW OF SYSTEMS:   Review of Systems  Constitutional: Positive for malaise/fatigue. Negative for chills, fever and weight loss.  HENT: Negative for congestion, ear pain and tinnitus.   Eyes: Negative.  Negative for blurred vision and double vision.   Respiratory: Positive for shortness of breath. Negative for cough and sputum production.   Cardiovascular: Negative.  Negative for chest pain, palpitations and leg swelling.  Gastrointestinal: Negative.  Negative for abdominal pain, constipation, diarrhea, nausea and vomiting.  Genitourinary: Negative for dysuria, frequency and urgency.  Musculoskeletal: Negative for back pain and falls.  Skin: Negative.  Negative for rash.  Neurological: Positive for weakness. Negative for headaches.  Endo/Heme/Allergies: Negative.  Does not bruise/bleed easily.  Psychiatric/Behavioral: Negative.  Negative for depression. The patient is not nervous/anxious and does not have insomnia.     As per HPI. Otherwise, a complete review of systems is negative.  PAST MEDICAL HISTORY: History reviewed. No pertinent past medical history.  PAST SURGICAL HISTORY: None  FAMILY HISTORY: History reviewed. No pertinent family history.  ADVANCED DIRECTIVES (Y/N):  _0 @  HEALTH MAINTENANCE: Social History   Tobacco Use  . Smoking status: Never Smoker  Substance Use Topics  . Alcohol use: Not Currently  . Drug use: Never     Colonoscopy:  PAP:  Bone density:  Lipid panel:  Allergies  Allergen Reactions  . Aspirin Swelling  . Tylenol [Acetaminophen] Other (See Comments)    Red face, tongue, and lips    Current Facility-Administered Medications  Medication Dose Route Frequency Provider Last Rate Last Admin  . (feeding supplement) PROSource Plus liquid 30 mL  30 mL Oral Daily AKurtis Bushman Sahar, MD   30 mL at 01/05/21 1414  . albuterol (VENTOLIN HFA) 108 (90 Base) MCG/ACT inhaler 2 puff  2 puff Inhalation Q6H PRN ANolberto Hanlon MD      .  allopurinol (ZYLOPRIM) tablet 300 mg  300 mg Oral Daily Mansy, Jan A, MD   300 mg at 01/05/21 1040  . azithromycin (ZITHROMAX) tablet 500 mg  500 mg Oral Daily Lu Duffel, RPH   500 mg at 01/05/21 1642  . cefTRIAXone (ROCEPHIN) 2 g in sodium chloride 0.9 % 100  mL IVPB  2 g Intravenous Q24H Mansy, Jan A, MD 200 mL/hr at 01/05/21 1639 2 g at 01/05/21 1639  . dextromethorphan-guaiFENesin (MUCINEX DM) 30-600 MG per 12 hr tablet 1 tablet  1 tablet Oral BID Mansy, Jan A, MD   1 tablet at 01/05/21 1040  . dextromethorphan-guaiFENesin (MUCINEX DM) 30-600 MG per 12 hr tablet 1 tablet  1 tablet Oral BID PRN Mansy, Arvella Merles, MD   1 tablet at 01/02/21 1711  . feeding supplement (ENSURE ENLIVE / ENSURE PLUS) liquid 237 mL  237 mL Oral BID BM Nolberto Hanlon, MD   237 mL at 01/05/21 1041  . ferrous sulfate tablet 325 mg  325 mg Oral Daily Mansy, Jan A, MD   325 mg at 01/05/21 1039  . latanoprost (XALATAN) 0.005 % ophthalmic solution 1 drop  1 drop Both Eyes QHS Mansy, Jan A, MD   1 drop at 01/04/21 2131  . magnesium gluconate (MAGONATE) tablet 500 mg  500 mg Oral Daily Mansy, Jan A, MD   500 mg at 01/05/21 1039  . magnesium hydroxide (MILK OF MAGNESIA) suspension 30 mL  30 mL Oral Daily PRN Mansy, Jan A, MD      . MEDLINE mouth rinse  15 mL Mouth Rinse BID Nolberto Hanlon, MD   15 mL at 01/05/21 1041  . midodrine (PROAMATINE) tablet 2.5 mg  2.5 mg Oral TID WC Nolberto Hanlon, MD   2.5 mg at 01/05/21 1639  . ondansetron (ZOFRAN) tablet 4 mg  4 mg Oral Q6H PRN Mansy, Jan A, MD       Or  . ondansetron [facility]) injection 4 mg  4 mg Intravenous Q6H PRN Mansy, Jan A, MD      . tamsulosin (FLOMAX) capsule 0.4 mg  0.4 mg Oral Daily Mansy, Jan A, MD   0.4 mg at 01/05/21 1040  . traZODone (DESYREL) tablet 25 mg  25 mg Oral QHS PRN Mansy, Jan A, MD   25 mg at 01/02/21 2205  . vitamin B-12 (CYANOCOBALAMIN) tablet 1,000 mcg  1,000 mcg Oral Daily Mansy, Jan A, MD   1,000 mcg at 01/05/21 1040    OBJECTIVE: Vitals:   01/05/21 1627 01/05/21 2010  BP: 98/60 128/66  Pulse: 71 81  Resp: 16 17  Temp: 98.3 F (36.8 C) 97.9 F (36.6 C)  SpO2: 91% 94%     Body mass index is 26.58 kg/m.    ECOG FS:3 - Symptomatic, >50% confined to bed  Physical Exam Constitutional:      Appearance: Normal  appearance.  HENT:     Head: Normocephalic and atraumatic.  Eyes:     Pupils: Pupils are equal, round, and reactive to light.  Cardiovascular:     Rate and Rhythm: Normal rate and regular rhythm.     Heart sounds: Normal heart sounds. No murmur heard.   Pulmonary:     Effort: Pulmonary effort is normal.     Breath sounds: Normal breath sounds. No wheezing.  Abdominal:     General: Bowel sounds are normal. There is no distension.     Palpations: Abdomen is soft.     Tenderness: There is no abdominal  tenderness.  Musculoskeletal:        General: Normal range of motion.     Cervical back: Normal range of motion.  Skin:    General: Skin is warm and dry.     Findings: No rash.  Neurological:     Mental Status: He is alert and oriented to person, place, and time.  Psychiatric:        Judgment: Judgment normal.      LAB RESULTS:  Lab Results  Component Value Date   NA 129 (L) 01/05/2021   K 3.8 01/05/2021   CL 98 01/05/2021   CO2 23 01/05/2021   GLUCOSE 216 (H) 01/05/2021   BUN 16 01/05/2021   CREATININE 1.07 01/05/2021   CALCIUM 8.2 (L) 01/05/2021   PROT 5.8 (L) 01/02/2021   ALBUMIN 3.0 (L) 01/02/2021   AST 24 01/02/2021   ALT 10 01/02/2021   ALKPHOS 62 01/02/2021   BILITOT 0.7 01/02/2021   GFRNONAA >60 01/05/2021    Lab Results  Component Value Date   WBC 1.5 (L) 01/05/2021   NEUTROABS 1.4 (L) 01/02/2021   HGB 9.8 (L) 01/05/2021   HCT 28.5 (L) 01/05/2021   MCV 81.2 01/05/2021   PLT 121 (L) 01/05/2021     STUDIES: CT Chest Wo Contrast  Result Date: 01/02/2021 CLINICAL DATA:  Cough, lung nodules, generalized weakness, shortness of breath EXAM: CT CHEST WITHOUT CONTRAST TECHNIQUE: Multidetector CT imaging of the chest was performed following the standard protocol without IV contrast. Sagittal and coronal MPR images reconstructed from axial data set. COMPARISON:  Chest radiograph 01/02/2021 FINDINGS: Cardiovascular: Atherosclerotic calcifications aorta,  proximal great vessels and coronary arteries. Upper normal caliber ascending thoracic aorta 3.9 cm diameter. Mitral annular and aortic valvular calcifications. No pericardial effusion. RIGHT jugular Port-A-Cath with tip in SVC. Mediastinum/Nodes: Esophagus unremarkable. Base of cervical region normal appearance. Few scattered normal size mediastinal lymph nodes without definite thoracic adenopathy. Lungs/Pleura: Small BILATERAL pleural effusions. Patchy predominantly peribronchial infiltrates throughout both lungs involving all lobes consistent with multifocal pneumonia. No pneumothorax. No discrete pulmonary mass. Upper Abdomen: Spleen appears enlarged, 13.6 x 8.9 cm image 145. Remaining visualized upper abdomen unremarkable. Musculoskeletal: No osseous abnormalities. Tiny subcutaneous nodule presternal 11 mm greatest diameter image 74 question epidermal inclusion cyst versus sebaceous cyst. IMPRESSION: Extensive BILATERAL patchy pulmonary infiltrates, predominantly perivascular, consistent with multifocal pneumonia. Radiographic follow-up until resolution recommended to exclude underlying pulmonary nodules. Small BILATERAL pleural effusions. Mild splenic enlargement. Scattered atherosclerotic calcifications including coronary arteries. Aortic Atherosclerosis (ICD10-I70.0). Electronically Signed   By: Lavonia Dana M.D.   On: 01/02/2021 15:55   DG Chest Portable 1 View  Result Date: 01/02/2021 CLINICAL DATA:  Cough and short of breath EXAM: PORTABLE CHEST 1 VIEW COMPARISON:  None. FINDINGS: Right-sided central venous port tip over the distal SVC. Widespread bilateral airspace opacities some of which are nodular in appearance. Possible small right effusion. Borderline cardiomegaly with aortic atherosclerosis. No pneumothorax. IMPRESSION: Widespread bilateral airspace opacities, some of which are nodular in appearance. Findings could be secondary to diffuse bilateral pneumonia, however given nodular appearance, the  possibility of pulmonary nodules is also raised. Suggest chest CT for further evaluation. Electronically Signed   By: Donavan Foil M.D.   On: 01/02/2021 15:26    ASSESSMENT:   PLAN:   B-cell lymphoma: -Treated in [place]-last treatment approximately 3 years ago with rituximab -Not on active treatment. -States he was seen by his oncologist at the end of April 2022 and noted low white counts. -Plan is  for 36-monthlabs with follow-up. -[facility] -We will get him set up with new patient consult with one of our oncologist when he is discharged.  Pancytopenia: -Secondary to viral infection, enlarged spleen and disease. -On admission, white blood cell count 1700 with ANC of 1400.  Platelet count was 118,000 and a slightly decreased over the course of his stay.  Hemoglobin 9.9. -Labs have improved today.  WBC increased from 1.0-1.5 today.  Platelet count improved from 86,000 221,000. -Continue to monitor labs.   Multifocal pneumonia: -Currently on IV antibiotics. -Appears to be improving  Plan: -Continue to monitor labs daily. -We will get patient scheduled once discharged at the cancer center to establish care with medical oncology [facility] -Recommend blood transfusion for hemoglobin less than 8 and platelets less than 20,000. -Anticipate improvement with IV antibiotics.    Patient expressed understanding and was in agreement with this plan. He also understands that He can call clinic at any time with any questions, concerns, or complaints.   Cancer Staging No matching staging information was found for the patient.  JJacquelin Hawking NP   01/05/2021 8:43 PM

## 2021-01-05 NOTE — Evaluation (Signed)
Occupational Therapy Evaluation Patient Details Name: Harry Strong MRN: [id number] DOB: 06/30/1935 Today's Date: 01/05/2021    History of Present Illness 86 y.o. male with medical history significant for B-cell lymphoma, BPH, gout and pancytopenia, who presented to the ER with acute onset of worsening dyspnea with associated to cough productive of clear sputum without wheezing.  He has been having intermittent low-grade fever that was up to 99 over the last few days.  He denies any chills.  No nausea or vomiting or abdominal pain.   Clinical Impression   Patient presenting with decreased I in self care, balance, functional mobility/transfer, endurance, and safety awareness. Patient reports being mod I with use of SPC  PTA. Pt lives with 2 roommates. They are available to help him as needed. Pt ambulates without use of SD with min guard - min A within the room this session. Pt initially on 2 L O2 and able to place pt on RA. He desaturated to 93% while standing at sink for grooming tasks and quickly recovered to 95% with rest. RN notified.Pt does fatigue quickly and would like HHOT services at discharge. Friends to assist with IADL tasks.Patient will benefit from acute OT to increase overall independence in the areas of ADLs, functional mobility, and safety awareness in order to safely discharge home.    Follow Up Recommendations  Home health OT;Supervision - Intermittent    Equipment Recommendations  None recommended by OT       Precautions / Restrictions Precautions Precautions: Fall      Mobility Bed Mobility Overal bed mobility: Modified Independent                  Transfers Overall transfer level: Needs assistance Equipment used: 1 person hand held assist Transfers: Sit to/from Stand;Stand Pivot Transfers Sit to Stand: Min guard Stand pivot transfers: Min assist       General transfer comment: cuing for technique and safety    Balance Overall balance assessment:  Needs assistance Sitting-balance support: Feet supported Sitting balance-Leahy Scale: Good     Standing balance support: During functional activity Standing balance-Leahy Scale: Fair Standing balance comment: min A from therapist                           ADL either performed or assessed with clinical judgement   ADL Overall ADL's : Needs assistance/impaired Eating/Feeding: Independent   Grooming: Wash/dry hands;Wash/dry face;Oral care;Standing;Min guard;Minimal assistance               Lower Body Dressing: Set up;Supervision/safety;Sitting/lateral leans   Toilet Transfer: Minimal assistance           Functional mobility during ADLs: Minimal assistance       Vision Baseline Vision/History: Wears glasses Wears Glasses: At all times Patient Visual Report: No change from baseline              Pertinent Vitals/Pain Pain Assessment: No/denies pain     Hand Dominance Right   Extremity/Trunk Assessment Upper Extremity Assessment Upper Extremity Assessment: Generalized weakness   Lower Extremity Assessment Lower Extremity Assessment: Generalized weakness   Cervical / Trunk Assessment Cervical / Trunk Assessment: Normal   Communication Communication Communication: No difficulties   Cognition Arousal/Alertness: Awake/alert Behavior During Therapy: WFL for tasks assessed/performed Overall Cognitive Status: Within Functional Limits for tasks assessed  General Comments: Pt is very pleasant and cooperative.              Home Living Family/patient expects to be discharged to:: Private residence Living Arrangements: Non-relatives/Friends Available Help at Discharge: Friend(s);Available 24 hours/day Type of Home: House Home Access: Stairs to enter [employer] of Steps: 2-3 Entrance Stairs-Rails: Right;Left Home Layout: One level               Home Equipment: Cane - single point           Prior Functioning/Environment Level of Independence: Independent with assistive device(s)        Comments: Pt lives with 2 roommates. He performs sink bathing and furniture walks within the home. Use of SPC in the community. He reports they all do IADL tasks in the home.        OT Problem List: Decreased strength;Impaired balance (sitting and/or standing);Decreased safety awareness;Cardiopulmonary status limiting activity;Decreased activity tolerance;Decreased knowledge of use of DME or AE      OT Treatment/Interventions: Self-care/ADL training;Manual therapy;Therapeutic exercise;Modalities;Patient/family education    OT Goals(Current goals can be found in the care plan section) Acute Rehab OT Goals Patient Stated Goal: to go home OT Goal Formulation: With patient Time For Goal Achievement: 01/19/21 Potential to Achieve Goals: Good ADL Goals Pt Will Perform Grooming: with modified independence Pt Will Perform Lower Body Dressing: with modified independence Pt Will Transfer to Toilet: with modified independence Pt Will Perform Toileting - Clothing Manipulation and hygiene: with modified independence  OT Frequency: Min 2X/week   Barriers to D/C:    none known at this time          AM-PAC OT "6 Clicks" Daily Activity     Outcome Measure Help from another person eating meals?: None Help from another person taking care of personal grooming?: None Help from another person toileting, which includes using toliet, bedpan, or urinal?: A Little Help from another person bathing (including washing, rinsing, drying)?: A Little Help from another person to put on and taking off regular upper body clothing?: None Help from another person to put on and taking off regular lower body clothing?: A Little 6 Click Score: 21   End of Session Nurse Communication: Mobility status;Other (comment) (removal of oxygen- no on RA)  Activity Tolerance: Patient tolerated treatment  well Patient left: in bed;with call bell/phone within reach;with bed alarm set  OT Visit Diagnosis: Unsteadiness on feet (R26.81);Muscle weakness (generalized) (M62.81)                Time: 7353-2992 OT Time Calculation (min): 24 min Charges:  OT General Charges $OT Visit: 1 Visit OT Evaluation $OT Eval Moderate Complexity: 1 Mod OT Treatments $Self Care/Home Management : 8-22 mins  Darleen Crocker, MS, OTR/L , CBIS ascom [phone]  01/05/21, 4:22 PM

## 2021-01-06 ENCOUNTER — Encounter: Payer: Self-pay | Admitting: Family Medicine

## 2021-01-06 ENCOUNTER — Inpatient Hospital Stay (HOSPITAL_COMMUNITY)
Admit: 2021-01-06 | Discharge: 2021-01-06 | Disposition: A | Discharge status: Home / Self Care | Payer: Medicare Other | Attending: Internal Medicine | Admitting: Internal Medicine

## 2021-01-06 DIAGNOSIS — M109 Gout, unspecified: Secondary | ICD-10-CM

## 2021-01-06 DIAGNOSIS — R011 Cardiac murmur, unspecified: Secondary | ICD-10-CM

## 2021-01-06 DIAGNOSIS — C8510 Unspecified B-cell lymphoma, unspecified site: Secondary | ICD-10-CM

## 2021-01-06 LAB — CBC
HCT: 27.2 % — ABNORMAL LOW (ref 39.0–52.0)
Hemoglobin: 9.3 g/dL — ABNORMAL LOW (ref 13.0–17.0)
MCH: 27.8 pg (ref 26.0–34.0)
MCHC: 34.2 g/dL (ref 30.0–36.0)
MCV: 81.4 fL (ref 80.0–100.0)
Platelets: 117 10*3/uL — ABNORMAL LOW (ref 150–400)
RBC: 3.34 MIL/uL — ABNORMAL LOW (ref 4.22–5.81)
RDW: 14.6 % (ref 11.5–15.5)
WBC: 1.6 10*3/uL — ABNORMAL LOW (ref 4.0–10.5)
nRBC: 0 % (ref 0.0–0.2)

## 2021-01-06 LAB — SODIUM: Sodium: 128 mmol/L — ABNORMAL LOW (ref 135–145)

## 2021-01-06 NOTE — Evaluation (Addendum)
Physical Therapy Evaluation Patient Details Name: Harry Strong MRN: [id number] DOB: [birth year] Today's Date: 01/06/2021   History of Present Illness  86 y.o. male with medical history significant for B-cell lymphoma, BPH, gout and pancytopenia, who presented to the ER with acute onset of worsening dyspnea with associated to cough productive of clear sputum without wheezing.  He has been having intermittent low-grade fever that was up to 99 over the last few days.  He denies any chills.  No nausea or vomiting or abdominal pain.  Admitted for management of sepsis and respiratory failure related to multifocal PNA  Clinical Impression  Patient sleeping in bed upon arrival to session; easily awakens to light touch.  Alert and oriented to basic information; follows commands and agreeable to mobility assessment with min encouragement.  Generally weak and deconditioned due to acute illness, but no focal weakness apparent.  Currently able to complete bed mobility with mod indep; sit/stand, basic transfers and gait (30' x1, 15' x1) with SPC, cga/close sup. Demonstrates forward flexed, kyphotic posture; shuffling steps with limited foot cleance.  Slow and cautious, limited balance reactions evident.  Mod SOB with activity, sats 88-89% on RA with short-distance gait (recovering >90% within 30 sec seated rest).  Fair/good awareness of limits of stability and overall safety needs; may benefit from trial of RW in subsequent sessions (to optimize posture, energy conservation). Would benefit from skilled PT to address above deficits and promote optimal return to PLOF.; Recommend transition to HHPT upon discharge from acute hospitalization.     Follow Up Recommendations Home health PT    Equipment Recommendations       Recommendations for Other Services       Precautions / Restrictions Precautions Precautions: Fall Restrictions Weight Bearing Restrictions: No      Mobility  Bed Mobility Overal bed  mobility: Modified Independent                  Transfers Overall transfer level: Needs assistance Equipment used: Straight cane Transfers: Sit to/from Stand Sit to Stand: Supervision         General transfer comment: sit/stand with and without assist device [facility]), close sup; does require UE support for optimal safety and stability  Ambulation/Gait Ambulation/Gait assistance: Min guard;Supervision Gait Distance (Feet):  (25' x1, 15' x1) Assistive device: Straight cane       General Gait Details: forward flexed, kyphotic posture; shuffling steps with limited foot cleance.  Slow and cautious, limited balance reactions evident.  Mod SOB with activity, sats 88-89% on RA with short-distance gait (recovering >90% within 30 sec seated rest)  Stairs            Wheelchair Mobility    Modified Rankin (Stroke Patients Only)       Balance Overall balance assessment: Needs assistance Sitting-balance support: No upper extremity supported;Feet supported Sitting balance-Leahy Scale: Good     Standing balance support: Single extremity supported Standing balance-Leahy Scale: Fair                               Pertinent Vitals/Pain Pain Assessment: No/denies pain    Home Living Family/patient expects to be discharged to:: Private residence Living Arrangements: Non-relatives/Friends Available Help at Discharge: Friend(s);Available 24 hours/day Type of Home: House Home Access: Stairs to enter Entrance Stairs-Rails: Psychiatric nurse of Steps: 2-3 Home Layout: One level Home Equipment: Cane - single point      Prior Function Level of Independence:  Independent with assistive device(s)         Comments: Pt lives with 2 roommates. He performs sink bathing and furniture walks within the home. Use of SPC in the community. He reports they all do IADL tasks in the home.  Denies fall history; no home O2.     Hand Dominance   Dominant  Hand: Right    Extremity/Trunk Assessment   Upper Extremity Assessment Upper Extremity Assessment: Generalized weakness    Lower Extremity Assessment Lower Extremity Assessment: Generalized weakness (grossly 4-/5 throughout)       Communication   Communication: No difficulties  Cognition  Arousal/Alertness: Awake/alert Behavior During Therapy: WFL for tasks assessed/performed Overall Cognitive Status: Within Functional Limits for tasks assessed                                        General Comments      Exercises Other Exercises Other Exercises: Seated LE therex, 1x15, active ROM for muscular strength/endurance: ankle pumps, LAQs, marching.  Encouraged performance outside of therapy as HEP Other Exercises: Sit/stand and standing balance at sink to use urinal, close sup; intermittently reaching for counter for external stabilization.  Fair/good awareness of limits of stability and overall safety needs.  OOB to chair x10 minutes during session for participation with therex; encouraged OOB through lunch for pulmonary hygiene.  Patient voiced understanding of recommendations, but voiced preference/insistence on return to bed at end of session.   Assessment/Plan    PT Assessment Patient needs continued PT services  PT Problem List Decreased strength;Decreased activity tolerance;Decreased balance;Decreased mobility;Decreased knowledge of use of DME;Decreased safety awareness;Decreased knowledge of precautions;Cardiopulmonary status limiting activity       PT Treatment Interventions DME instruction;Gait training;Stair training;Functional mobility training;Balance training;Therapeutic exercise;Therapeutic activities;Patient/family education    PT Goals (Current goals can be found in the Care Plan section)  Acute Rehab PT Goals Patient Stated Goal: to go home PT Goal Formulation: With patient Time For Goal Achievement: 01/20/21 Potential to Achieve Goals: Good     Frequency Min 2X/week   Barriers to discharge        Co-evaluation               AM-PAC PT "6 Clicks" Mobility  Outcome Measure Help needed turning from your back to your side while in a flat bed without using bedrails?: None Help needed moving from lying on your back to sitting on the side of a flat bed without using bedrails?: None Help needed moving to and from a bed to a chair (including a wheelchair)?: None Help needed standing up from a chair using your arms (e.g., wheelchair or bedside chair)?: None Help needed to walk in hospital room?: None Help needed climbing 3-5 steps with a railing? : A Little 6 Click Score: 23    End of Session   Activity Tolerance: Patient tolerated treatment well Patient left: in bed;with call bell/phone within reach;with bed alarm set   PT Visit Diagnosis: Muscle weakness (generalized) (M62.81);Difficulty in walking, not elsewhere classified (R26.2)    Time: 8016-5537 PT Time Calculation (min) (ACUTE ONLY): 44 min   Charges:   PT Evaluation $PT Eval Moderate Complexity: 1 Mod PT Treatments $Therapeutic Exercise: 8-22 mins $Therapeutic Activity: 8-22 mins       Fayez Sturgell H. Owens Shark, PT, DPT, NCS 01/06/21, 12:20 PM [phone]

## 2021-01-06 NOTE — TOC Initial Note (Signed)
Transition of Care [facility]) - Initial/Assessment Note    Patient Details  Name: Harry Strong MRN: [id number] Date of Birth: 08-28-1934  [facility] CM/SW Contact:    Shelbie Hutching, RN Phone Number: 01/06/2021, 2:32 PM  Clinical Narrative:                 Patient recently moved to [facility] from [place].  Patient reports that he lives with 2 roommates that he has know for the past 30 years.  Patient admitted to the hospital with pneumonia.  Patient does not have a PCP set up but RNCM will reach out to [facility] to set up primary care and hospital follow up.  Patient agrees to home health services at discharge and is okay with [facility].  Corene Cornea with [facility] given referral for RN, PT, and OT.  Patient thinks that he will be able to get one of his roommates to pick him up at discharge but otherwise will need transport home.  RNCM can arrange [facility] to get patient home at discharge.    1444:  PCP appointment scheduled for Tuesday May 24th at [place] with Georgian Co NP at 0930.    Expected Discharge Plan: [place] Barriers to Discharge: Continued Medical Work up   Patient Goals and CMS Choice Patient states their goals for this hospitalization and ongoing recovery are:: agrees to home health services CMS Medicare.gov Compare Post Acute Care list provided to:: Patient Choice offered to / list presented to : Patient  Expected Discharge Plan and Services Expected Discharge Plan: [place]   Discharge Planning Services: CM Consult Post Acute Care Choice: [place] arrangements for the past 2 months: [place]: RN,PT,OT [place] Agency: [place] ([place]) Date [place]: 01/06/21 Time [place]: 1432 Representative spoke with at [place]: Corene Cornea  Prior Living Arrangements/Services Living arrangements for the past 2 months:  [place] with:: Roommate Patient language and need for interpreter reviewed:: Yes Do you feel safe going back to the place where you live?: Yes      Need for Family Participation in Patient Care: Yes (Comment) (pneumonia) Care giver support system in place?: Yes (comment) (roommates)   Criminal Activity/Legal Involvement Pertinent to Current Situation/Hospitalization: No - Comment as needed  Activities of Daily Living Home Assistive Devices/Equipment: Cane (specify quad or straight) ADL Screening (condition at time of admission) Patient's cognitive ability adequate to safely complete daily activities?: Yes Is the patient deaf or have difficulty hearing?: No Does the patient have difficulty seeing, even when wearing glasses/contacts?: No Does the patient have difficulty concentrating, remembering, or making decisions?: Yes Patient able to express need for assistance with ADLs?: Yes Does the patient have difficulty dressing or bathing?: Yes Independently performs ADLs?: Yes (appropriate for developmental age) Does the patient have difficulty walking or climbing stairs?: Yes Weakness of Legs: Both Weakness of Arms/Hands: None  Permission Sought/Granted Permission sought to share information with : Case Manager,Facility Contact Representative,Other (comment) Permission granted to share information with : Yes, Verbal Permission Granted     Permission granted to share info w AGENCY: [facility]        Emotional Assessment Appearance:: Appears stated age Attitude/Demeanor/Rapport: Engaged Affect (typically observed): Accepting Orientation: :  Oriented to Self,Oriented to Place,Oriented to  Time,Oriented to Situation Alcohol / Substance Use: Not Applicable Psych Involvement: No (comment)  Admission diagnosis:  Multifocal pneumonia [J18.9] Patient Active Problem List   Diagnosis Date Noted  . Multifocal pneumonia 01/02/2021   PCP:  Pcp,  No Pharmacy:  No Pharmacies Listed    Social Determinants of Health (SDOH) Interventions    Readmission Risk Interventions No flowsheet data found.

## 2021-01-06 NOTE — Progress Notes (Addendum)
PROGRESS NOTE    Harry Strong  FVC:[id number] DOB: 1934-12-29 DOA: 01/02/2021 PCP: Merryl Hacker, No    Brief Narrative:  Harry Strong is a 86 y.o. male with medical history significant for B-cell lymphoma, BPH, gout and pancytopenia, who presented to the ER with acute onset of worsening dyspnea with associated to cough productive of clear sputum without wheezing.  He has been having intermittent low-grade fever that was up to 99 over the last few days.  He denies any chills.  No nausea or vomiting or abdominal pain.  He denies any chest pain or palpitations.  No dysuria, degree of hematuria or flank pain. ED Course: When he came to the ER vital signs were within normal.  His pulse oximetry briefly dropped to 88% on room air.  Later respiratory rate was 27 then 25.  Labs revealed leukopenia of 1.7 with anemia of 9.9/28.6 and thrombocytopenia 118 with neutropenia with ANC of 1.4.  UA was unremarkable  cxr with b/l infiltrates. Being treated for PAN.  5/17- Na down today, sob little better but still feels sob.  No chest pain, no nausea or vomiting   Consultants:   hematology Nephrology  Procedures:  CT chest: Extensive BILATERAL patchy pulmonary infiltrates, predominantly perivascular, consistent with multifocal pneumonia. Radiographic follow-up until resolution recommended to exclude underlying pulmonary nodules. Small BILATERAL pleural effusions. Mild splenic enlargement. Scattered atherosclerotic calcifications including coronary arteries. Aortic Atherosclerosis (ICD10-I70.0).  Antimicrobials:   Ceftriaxone and azithromycin 5/13>>>>   Subjective: As above  Objective: Vitals:   01/05/21 1132 01/05/21 1627 01/05/21 2010 01/06/21 0046  BP: 96/61 98/60 128/66 (!) 112/53  Pulse: 68 71 81 73  Resp: _0 Temp: 97.8 F (36.6 C) 98.3 F (36.8 C) 97.9 F (36.6 C) 99.7 F (37.6 C)  TempSrc:   Oral   SpO2: 96% 91% 94% 91%  Weight:      Height:         Intake/Output Summary (Last 24 hours) at 01/06/2021 0748 Last data filed at 01/06/2021 0500 Gross per 24 hour  Intake 4013.27 ml  Output 1650 ml  Net 2363.27 ml   Filed Weights   01/02/21 1352  Weight: 81.6 kg    Examination: Calm, NAD   clear to auscultation, no wheeze rales rhonchi's Regular S1-S2 no gallops 3 out of 6 systolic ejection murmur harsh left sternal base Soft benign positive bowel sounds No edema Awake and alert, grossly intact Mood and affect appropriate in current setting    Data Reviewed: I have personally reviewed following labs and imaging studies  CBC: Recent Labs  Lab 01/02/21 1404 01/03/21 0153 01/04/21 0458 01/05/21 0834 01/06/21 0449  WBC 1.7* 1.3* 1.0* 1.5* 1.6*  NEUTROABS 1.4*  --   --   --   --   HGB 9.9* 9.4* 8.8* 9.8* 9.3*  HCT 28.6* 27.4* 26.3* 28.5* 27.2*  MCV 79.4* 80.4 80.9 81.2 81.4  PLT 118* 94* 86* 121* 638*   Basic Metabolic Panel: Recent Labs  Lab 01/02/21 1404 01/03/21 0153 01/04/21 0458 01/05/21 0834 01/06/21 0449  NA 127* 127* 131* 129* 128*  K 3.7 3.8 3.9 3.8  --   CL 97* 98 101 98  --   CO2 21* _1 --   GLUCOSE 129* 104* 116* 216*  --   BUN _2 --   CREATININE 1.15 1.08 1.06 1.07  --   CALCIUM 8.4* 8.1* 7.9* 8.2*  --    GFR: Estimated Creatinine Clearance: 51.4  mL/min (by C-G formula based on SCr of 1.07 mg/dL). Liver Function Tests: Recent Labs  Lab 01/02/21 1404  AST 24  ALT 10  ALKPHOS 62  BILITOT 0.7  PROT 5.8*  ALBUMIN 3.0*   No results for input(s): LIPASE, AMYLASE in the last 168 hours. No results for input(s): AMMONIA in the last 168 hours. Coagulation Profile: No results for input(s): INR, PROTIME in the last 168 hours. Cardiac Enzymes: No results for input(s): CKTOTAL, CKMB, CKMBINDEX, TROPONINI in the last 168 hours. BNP (last 3 results) No results for input(s): PROBNP in the last 8760 hours. HbA1C: No results for input(s): HGBA1C in the last 72 hours. CBG: No  results for input(s): GLUCAP in the last 168 hours. Lipid Profile: No results for input(s): CHOL, HDL, LDLCALC, TRIG, CHOLHDL, LDLDIRECT in the last 72 hours. Thyroid Function Tests: No results for input(s): TSH, T4TOTAL, FREET4, T3FREE, THYROIDAB in the last 72 hours. Anemia Panel: No results for input(s): VITAMINB12, FOLATE, FERRITIN, TIBC, IRON, RETICCTPCT in the last 72 hours. Sepsis Labs: Recent Labs  Lab 01/02/21 2322 01/03/21 0153  LATICACIDVEN 1.4 0.9    Recent Results (from the past 240 hour(s))  Resp Panel by RT-PCR (Flu A&B, Covid) Nasopharyngeal Swab     Status: None   Collection Time: 01/02/21  3:08 PM   Specimen: Nasopharyngeal Swab; Nasopharyngeal(NP) swabs in vial transport medium  Result Value Ref Range Status   SARS Coronavirus 2 by RT PCR NEGATIVE NEGATIVE Final    Comment: (NOTE) SARS-CoV-2 target nucleic acids are NOT DETECTED.  The SARS-CoV-2 RNA is generally detectable in upper respiratory specimens during the acute phase of infection. The lowest concentration of SARS-CoV-2 viral copies this assay can detect is 138 copies/mL. A negative result does not preclude SARS-Cov-2 infection and should not be used as the sole basis for treatment or other patient management decisions. A negative result may occur with  improper specimen collection/handling, submission of specimen other than nasopharyngeal swab, presence of viral mutation(s) within the areas targeted by this assay, and inadequate number of viral copies(<138 copies/mL). A negative result must be combined with clinical observations, patient history, and epidemiological information. The expected result is Negative.  Fact Sheet for Patients:  EntrepreneurPulse.com.au  Fact Sheet for Healthcare Providers:  IncredibleEmployment.be  This test is no t yet approved or cleared by the [place] FDA and  has been authorized for detection and/or diagnosis of SARS-CoV-2  by FDA under an Emergency Use Authorization (EUA). This EUA will remain  in effect (meaning this test can be used) for the duration of the COVID-19 declaration under Section 564(b)(1) of the Act, 21 U.S.C.section 360bbb-3(b)(1), unless the authorization is terminated  or revoked sooner.       Influenza A by PCR NEGATIVE NEGATIVE Final   Influenza B by PCR NEGATIVE NEGATIVE Final    Comment: (NOTE) The Xpert Xpress SARS-CoV-2/FLU/RSV plus assay is intended as an aid in the diagnosis of influenza from Nasopharyngeal swab specimens and should not be used as a sole basis for treatment. Nasal washings and aspirates are unacceptable for Xpert Xpress SARS-CoV-2/FLU/RSV testing.  Fact Sheet for Patients: EntrepreneurPulse.com.au  Fact Sheet for Healthcare Providers: IncredibleEmployment.be  This test is not yet approved or cleared by the [place] FDA and has been authorized for detection and/or diagnosis of SARS-CoV-2 by FDA under an Emergency Use Authorization (EUA). This EUA will remain in effect (meaning this test can be used) for the duration of the COVID-19 declaration under Section 564(b)(1) of the Act,  21 U.S.C. section 360bbb-3(b)(1), unless the authorization is terminated or revoked.  Performed at [facility], [address], [place], [place] [address]          Radiology Studies: No results found.      Scheduled Meds: . (feeding supplement) PROSource Plus  30 mL Oral Daily  . allopurinol  300 mg Oral Daily  . azithromycin  500 mg Oral Daily  . dextromethorphan-guaiFENesin  1 tablet Oral BID  . feeding supplement  237 mL Oral BID BM  . ferrous sulfate  325 mg Oral Daily  . latanoprost  1 drop Both Eyes QHS  . magnesium gluconate  500 mg Oral Daily  . mouth rinse  15 mL Mouth Rinse BID  . midodrine  2.5 mg Oral TID WC  . tamsulosin  0.4 mg Oral Daily  . vitamin B-12  1,000 mcg Oral Daily   Continuous  Infusions: . cefTRIAXone (ROCEPHIN)  IV 2 g (01/05/21 1639)    Assessment & Plan:   Active Problems:   Multifocal pneumonia   1.  Multifocal pneumonia with subsequent acute respiratory failure with hypoxia.   The patient has subsequent sepsis as manifested by leukopenia and tachypnea due to PNA.  02 sat 87% on RA..>94% on 2 L Lactic acid nml 5/17 slow improvement still not at baseline still experiencing shortness of breath  Continue IV antibiotics with Rocephin and azithromycin  I-S, flutter valve  Mucolytic's, albuterol MDI  COVID is negative      2. Acute respiratory failure with hypoxia Due to multifocal pneumonia  Improving, off oxygen on room air  Will need ambulatory O2 sats prior to DC  Continue IV antibiotics as above     3.  Sepsis- due to PNA Hemodynamically stable WBC up at 1.6 Continue IV antibiotics    4.  Pancytopenia with neutropenia and B-cell lymphoma. - The patient will be placed on reverse isolation and will follow CBC.. - Continues vitamin B12. Heme's input was appreciated-hold  Granix as it can interfere with bone marrow biopsy results should he have recurrence of his lymphoma. Transfuse for hemoglobin less than 8 and platelets less than 20,000 They anticipate improvement with IV antibiotics 5/17 WBC up at 1.6  Platelets Little low at 117 Patient's care is in [place] and he will need to transfer his care to [facility] will set him up as a new consult to see one of the oncologist at discharge  5.hyponatremia-  asx Possibly 2/2 pna and dehydration..>improved, but down more to 128 today Possibly has some component of SIADH Nephrology consulted Will place on fluid restriction .  6. Murmur- consistent with AS. Last echo >1 yr ago in [place]. Will obtain echo   7.  BPH Continue Flomax  8. Gout. - Continue allopurinol.  9.  Glaucoma. - continue his ophthalmic gtt.   DVT prophylaxis: scd Code  Status:full Family Communication: none at bedside  Status is: Inpatient  Remains inpatient appropriate because:Inpatient level of care appropriate due to severity of illness   Dispo: The patient is from: Home              Anticipated d/c is to: Home              Patient currently is not medically stable to d/c.   Difficult to place patient No            LOS: 4 days   Time spent: 35 minutes with more than 50% on COC  Nolberto Hanlon, MD [facility] Pager [phone]  If 7PM-7AM, please contact night-coverage 01/06/2021, 7:48 AM

## 2021-01-06 NOTE — Progress Notes (Signed)
OT Cancellation Note  Patient Details Name: Harry Strong MRN: [id number] DOB: 12/23/1934   Cancelled Treatment:    Reason Eval/Treat Not Completed: Patient declined, no reason specified. Upon entering the room, pt supine in bed with towel over face and reports headache this afternoon. Pt declined OOB therapeutic activity and asked if he needed medication and he declined. OT will re-attempt when pt is able to actively participate.   Darleen Crocker, MS, OTR/L , CBIS ascom [phone]  01/06/21, 3:51 PM  01/06/2021, 3:45 PM

## 2021-01-06 NOTE — Consult Note (Signed)
[facility] Consult Note:    Date of Admission:  01/02/2021           Reason for Consult: hyponatremia    Referring Provider: Nolberto Hanlon, MD Primary Care Provider: Pcp, No   History of Presenting Illness:  Harry Strong is a 86 y.o. male  Patient originally presented to the emergency room for acute onset of worsening dyspnea associated with cough productive of clear sputum without wheezing.  He had intermittent low-grade fever few days prior to admission.  In the ER patient was briefly hypoxemic and noted to be leukopenic and anemic.  Chest x-ray showed bilateral airspace opacities consistent with bilateral pneumonia patient also had CT chest without contrast which showed pneumonia. Patient is now admitted for management of acute respiratory failure with hypoxia secondary to multifocal pneumonia.  He is also getting investigation for pancytopenia, neutropenia in the setting of B-cell lymphoma. Nephrology consult has been requested for hyponatremia No care everywhere records are available for review Patient's sodium level is noted to be between 127-131 in the last few days.   Review of Systems: ROS   Gen: Denies any fevers or chills HEENT: No vision or hearing problems CV: No chest pain or shortness of breath.  Has a history of heart murmur, aortic stenosis Resp: No cough or sputum production at present.  It has improved since admission GI: No nausea, vomiting or diarrhea.  No blood in the stool GU : No problems with voiding.  No hematuria.  No previous history of kidney problems MS: Ambulatory.  Denies any acute joint pain or swelling Derm:   No complaints Psych: No complaints Heme: No complaints Neuro: No complaints Endocrine: No complaints   History reviewed. No pertinent past medical history.  Social History   Tobacco Use  . Smoking status: Never Smoker  Substance Use Topics  . Alcohol use: Not Currently  . Drug use: Never    History  reviewed. No pertinent family history.   OBJECTIVE: Blood pressure (!) 113/57, pulse 73, temperature 98.1 F (36.7 C), temperature source Oral, resp. rate 18, height 5\' 9"  (1.753 m), weight 81.6 kg, SpO2 92 %.  Physical Exam  Physical Exam: General:  No acute distress, laying in the bed  HEENT  anicteric, moist oral mucous membrane  Pulm/lungs  normal breathing effort, lungs are clear to auscultation  CVS/Heart  regular rhythm, no rub or gallop, Cassandra systolic murmur  Abdomen:   Soft, nontender, distended with everted umbilicus  Extremities:  No peripheral edema  Neurologic:  Alert, oriented, able to follow commands  Skin:  No acute rashes     Lab Results Lab Results  Component Value Date   WBC 1.6 (L) 01/06/2021   HGB 9.3 (L) 01/06/2021   HCT 27.2 (L) 01/06/2021   MCV 81.4 01/06/2021   PLT 117 (L) 01/06/2021    Lab Results  Component Value Date   CREATININE 1.07 01/05/2021   BUN 16 01/05/2021   NA 128 (L) 01/06/2021   K 3.8 01/05/2021   CL 98 01/05/2021   CO2 23 01/05/2021    Lab Results  Component Value Date   ALT 10 01/02/2021   AST 24 01/02/2021   ALKPHOS 62 01/02/2021   BILITOT 0.7 01/02/2021     Microbiology: Recent Results (from the past 240 hour(s))  Resp Panel by RT-PCR (Flu A&B, Covid) Nasopharyngeal Swab     Status: None   Collection Time: 01/02/21  3:08 PM   Specimen: Nasopharyngeal Swab; Nasopharyngeal(NP) swabs in  vial transport medium  Result Value Ref Range Status   SARS Coronavirus 2 by RT PCR NEGATIVE NEGATIVE Final    Comment: (NOTE) SARS-CoV-2 target nucleic acids are NOT DETECTED.  The SARS-CoV-2 RNA is generally detectable in upper respiratory specimens during the acute phase of infection. The lowest concentration of SARS-CoV-2 viral copies this assay can detect is 138 copies/mL. A negative result does not preclude SARS-Cov-2 infection and should not be used as the sole basis for treatment or other patient management decisions. A  negative result may occur with  improper specimen collection/handling, submission of specimen other than nasopharyngeal swab, presence of viral mutation(s) within the areas targeted by this assay, and inadequate number of viral copies(<138 copies/mL). A negative result must be combined with clinical observations, patient history, and epidemiological information. The expected result is Negative.  Fact Sheet for Patients:  EntrepreneurPulse.com.au  Fact Sheet for Healthcare Providers:  IncredibleEmployment.be  This test is no t yet approved or cleared by the [place] FDA and  has been authorized for detection and/or diagnosis of SARS-CoV-2 by FDA under an Emergency Use Authorization (EUA). This EUA will remain  in effect (meaning this test can be used) for the duration of the COVID-19 declaration under Section 564(b)(1) of the Act, 21 U.S.C.section 360bbb-3(b)(1), unless the authorization is terminated  or revoked sooner.       Influenza A by PCR NEGATIVE NEGATIVE Final   Influenza B by PCR NEGATIVE NEGATIVE Final    Comment: (NOTE) The Xpert Xpress SARS-CoV-2/FLU/RSV plus assay is intended as an aid in the diagnosis of influenza from Nasopharyngeal swab specimens and should not be used as a sole basis for treatment. Nasal washings and aspirates are unacceptable for Xpert Xpress SARS-CoV-2/FLU/RSV testing.  Fact Sheet for Patients: EntrepreneurPulse.com.au  Fact Sheet for Healthcare Providers: IncredibleEmployment.be  This test is not yet approved or cleared by the [place] FDA and has been authorized for detection and/or diagnosis of SARS-CoV-2 by FDA under an Emergency Use Authorization (EUA). This EUA will remain in effect (meaning this test can be used) for the duration of the COVID-19 declaration under Section 564(b)(1) of the Act, 21 U.S.C. section 360bbb-3(b)(1), unless the authorization  is terminated or revoked.  Performed at [facility], [address], [place], [place] [address]     Medications: Scheduled Meds: . (feeding supplement) PROSource Plus  30 mL Oral Daily  . allopurinol  300 mg Oral Daily  . azithromycin  500 mg Oral Daily  . dextromethorphan-guaiFENesin  1 tablet Oral BID  . feeding supplement  237 mL Oral BID BM  . ferrous sulfate  325 mg Oral Daily  . latanoprost  1 drop Both Eyes QHS  . magnesium gluconate  500 mg Oral Daily  . mouth rinse  15 mL Mouth Rinse BID  . midodrine  2.5 mg Oral TID WC  . tamsulosin  0.4 mg Oral Daily  . vitamin B-12  1,000 mcg Oral Daily   Continuous Infusions: . cefTRIAXone (ROCEPHIN)  IV 2 g (01/05/21 1639)   PRN Meds:.albuterol, dextromethorphan-guaiFENesin, magnesium hydroxide, ondansetron **OR** ondansetron (ZOFRAN) IV, traZODone  Allergies  Allergen Reactions  . Aspirin Swelling  . Tylenol [Acetaminophen] Other (See Comments)    Red face, tongue, and lips    Urinalysis: No results for input(s): COLORURINE, LABSPEC, PHURINE, GLUCOSEU, HGBUR, BILIRUBINUR, KETONESUR, PROTEINUR, UROBILINOGEN, NITRITE, LEUKOCYTESUR in the last 72 hours.  Invalid input(s): APPERANCEUR    Imaging: No results found.    Assessment/Plan:  Harry Strong is  a 86 y.o. 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 In the meantime hyponatremia can be managed with fluid restriction of about 1200 cc/day  #Ascites versus urinary retention Bladder scan with urine amount of 125 mL Consider abdominal ultrasound to   Harry Strong 01/06/21

## 2021-01-06 NOTE — Plan of Care (Signed)
  Problem: Education: Goal: Knowledge of General Education information will improve Description: Including pain rating scale, medication(s)/side effects and non-pharmacologic comfort measures Outcome: Progressing   Problem: Health Behavior/Discharge Planning: Goal: Ability to manage health-related needs will improve Outcome: Progressing   Problem: Clinical Measurements: Goal: Will remain free from infection Outcome: Progressing Goal: Respiratory complications will improve Outcome: Progressing Goal: Cardiovascular complication will be avoided Outcome: Progressing   Problem: Activity: Goal: Risk for activity intolerance will decrease Outcome: Progressing   Problem: Coping: Goal: Level of anxiety will decrease Outcome: Progressing   Problem: Pain Managment: Goal: General experience of comfort will improve Outcome: Progressing

## 2021-01-07 ENCOUNTER — Inpatient Hospital Stay: Payer: Medicare Other

## 2021-01-07 ENCOUNTER — Encounter: Payer: Self-pay | Admitting: Family Medicine

## 2021-01-07 LAB — ECHOCARDIOGRAM COMPLETE
AR max vel: 1.47 cm2
AV Area VTI: 1.57 cm2
AV Area mean vel: 1.37 cm2
AV Mean grad: 21 mmHg
AV Peak grad: 31.6 mmHg
Ao pk vel: 2.81 m/s
Area-P 1/2: 3.48 cm2
Height: 69 in
MV M vel: 5.8 m/s
MV Peak grad: 134.6 mmHg
S' Lateral: 2.14 cm
Weight: 2880 oz

## 2021-01-07 LAB — SODIUM, URINE, RANDOM: Sodium, Ur: 76 mmol/L

## 2021-01-07 LAB — VITAMIN B12: Vitamin B-12: 463 pg/mL (ref 180–914)

## 2021-01-07 LAB — BASIC METABOLIC PANEL
Anion gap: 9 (ref 5–15)
BUN: 21 mg/dL (ref 8–23)
CO2: 25 mmol/L (ref 22–32)
Calcium: 8.5 mg/dL — ABNORMAL LOW (ref 8.9–10.3)
Chloride: 96 mmol/L — ABNORMAL LOW (ref 98–111)
Creatinine, Ser: 1.03 mg/dL (ref 0.61–1.24)
GFR, Estimated: >60 mL/min (ref >60)
Glucose, Bld: 142 mg/dL — ABNORMAL HIGH (ref 70–99)
Potassium: 5 mmol/L (ref 3.5–5.1)
Sodium: 130 mmol/L — ABNORMAL LOW (ref 135–145)

## 2021-01-07 LAB — CORTISOL: Cortisol, Plasma: 19.9 ug/dL

## 2021-01-07 LAB — OSMOLALITY, URINE: Osmolality, Ur: 438 mOsm/kg (ref 300–900)

## 2021-01-07 LAB — TSH: TSH: 2.414 u[IU]/mL (ref 0.350–4.500)

## 2021-01-07 LAB — MAGNESIUM: Magnesium: 2 mg/dL (ref 1.7–2.4)

## 2021-01-07 MED ORDER — SODIUM CHLORIDE 0.9 % IV SOLN
2.0000 g | INTRAVENOUS | Status: DC
  Administered 2021-01-07: 2 g via INTRAVENOUS
  Filled 2021-01-07: qty 2
  Filled 2021-01-07: qty 20

## 2021-01-07 MED ORDER — AZITHROMYCIN 500 MG PO TABS
500.0000 mg | ORAL_TABLET | Freq: Every day | ORAL | Status: DC
  Administered 2021-01-07: 500 mg via ORAL
  Filled 2021-01-07: qty 1

## 2021-01-07 NOTE — Progress Notes (Signed)
PROGRESS NOTE    Harry Strong  CLE:[id number] DOB: 1935-04-14 DOA: 01/02/2021 PCP: Mechele Claude, FNP   Brief Narrative: 86 year old with past medical history significant for B-cell lymphoma, BPH, gout, pancytopenia who presented to the ED with acute onset of worsening shortness of breath associated with cough.  He reports intermittent low-grade fever for a few days.  Was initially found to be hypoxic oxygen saturation 88 on room air, respiration rate 27, leukopenia White blood cell 1.7 chest x-ray bilateral infiltrates. Admitted for pneumonia and hyponatremia.    Assessment & Plan:   Active Problems:   Multifocal pneumonia   1-Multifocal pneumonia, acute hypoxic respiratory failure; -Presented with oxygen saturation 87-88 on room air.  Chest x-ray with bilateral infiltrate, pancytopenia -Continue with IV ceftriaxone and azithromycin day 3. -Continue with flutter valve, Mucinex. -COVID-19 negative.   2-Acute hypoxic respiratory failure: Secondary to multifocal pneumonia: See #1  3-Sepsis due to pneumonia: POA Presented with tachypnea, leukopenia, pneumonia on chest x-ray Improved.   4-Anemia with neutropenia and history of B-cell lymphoma: -Continue with isolation, continue B12 supplements. -Hematology was consulted plan to hold Granix because it can interfere with bone marrow biopsy should he have recurrence of his lymphoma. -Hematology anticipate improvement of pancytopenia with antibiotics -Patient will need to transfer his care to [facility] from [place] -Hematology will arrange follow-up -US splenomegaly.   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 Improving, appreciate nephrology follow-up  6-Murmur; consistent with aortic stenosis, mitral tricuspid valve rehabilitation: He will need follow-up as an outpatient  BPH: Continue with Flomax Gout: Continue with allopurinol Glaucoma: Continue with ophthalmic drops  Nutrition Problem:  Increased nutrient needs Etiology: acute illness,chronic illness    Signs/Symptoms: estimated needs    Interventions: Ensure Enlive (each supplement provides 350kcal and 20 grams of protein),Prostat  Estimated body mass index is 26.58 kg/m as calculated from the following:   Height as of this encounter: '5\' 9"'  (1.753 m).   Weight as of this encounter: 81.6 kg.   DVT prophylaxis: SCD Code Status: Full code Family Communication: Care discussed with patient.  Disposition Plan:  Status is: Inpatient  Remains inpatient appropriate because:IV treatments appropriate due to intensity of illness or inability to take PO   Dispo: The patient is from: Home              Anticipated d/c is to: Home              Patient currently is not medically stable to d/c.   Difficult to place patient No        Consultants:   Nephrology   Procedures:   [place];     Antimicrobials:    Subjective: He report cramps hands and legs, has had this problem for some time. He drinks water to help with cramps.  He is feeling weak.    Objective: Vitals:   01/06/21 2342 01/07/21 0343 01/07/21 0851 01/07/21 1143  BP: (!) 116/52 119/60 (!) 104/58 (!) 110/49  Pulse: 72 75 75 73  Resp: '16 16 18 18  ' Temp: 99 F (37.2 C) 99.8 F (37.7 C) 98.4 F (36.9 C) 98.5 F (36.9 C)  TempSrc:  Oral Oral Oral  SpO2: 93% 98% 97% 96%  Weight:      Height:        Intake/Output Summary (Last 24 hours) at 01/07/2021 1501 Last data filed at 01/07/2021 1352 Gross per 24 hour  Intake 360 ml  Output 850 ml  Net -490 ml   Filed  Weights   01/02/21 1352  Weight: 81.6 kg    Examination:  General exam: Appears calm and comfortable  Respiratory system: Clear to auscultation. Respiratory effort normal. Cardiovascular system: S1 & S2 heard, RRR. No JVD, murmurs, rubs, gallops or clicks. No pedal edema. Gastrointestinal system: Abdomen is distended, soft and nontender. No organomegaly or masses felt. Normal  bowel sounds heard. Central nervous system: Alert and oriented. No focal neurological deficits. Extremities: Symmetric 5 x 5 power.    Data Reviewed: I have personally reviewed following labs and imaging studies  CBC: Recent Labs  Lab 01/02/21 1404 01/03/21 0153 01/04/21 0458 01/05/21 0834 01/06/21 0449  WBC 1.7* 1.3* 1.0* 1.5* 1.6*  NEUTROABS 1.4*  --   --   --   --   HGB 9.9* 9.4* 8.8* 9.8* 9.3*  HCT 28.6* 27.4* 26.3* 28.5* 27.2*  MCV 79.4* 80.4 80.9 81.2 81.4  PLT 118* 94* 86* 121* 194*   Basic Metabolic Panel: Recent Labs  Lab 01/02/21 1404 01/03/21 0153 01/04/21 0458 01/05/21 0834 01/06/21 0449 01/07/21 0412  NA 127* 127* 131* 129* 128* 130*  K 3.7 3.8 3.9 3.8  --  5.0  CL 97* 98 101 98  --  96*  CO2 21* '22 23 23  ' --  25  GLUCOSE 129* 104* 116* 216*  --  142*  BUN '18 19 17 16  ' --  21  CREATININE 1.15 1.08 1.06 1.07  --  1.03  CALCIUM 8.4* 8.1* 7.9* 8.2*  --  8.5*  MG  --   --   --   --   --  2.0   GFR: Estimated Creatinine Clearance: 53.4 mL/min (by C-G formula based on SCr of 1.03 mg/dL). Liver Function Tests: Recent Labs  Lab 01/02/21 1404  AST 24  ALT 10  ALKPHOS 62  BILITOT 0.7  PROT 5.8*  ALBUMIN 3.0*   No results for input(s): LIPASE, AMYLASE in the last 168 hours. No results for input(s): AMMONIA in the last 168 hours. Coagulation Profile: No results for input(s): INR, PROTIME in the last 168 hours. Cardiac Enzymes: No results for input(s): CKTOTAL, CKMB, CKMBINDEX, TROPONINI in the last 168 hours. BNP (last 3 results) No results for input(s): PROBNP in the last 8760 hours. HbA1C: No results for input(s): HGBA1C in the last 72 hours. CBG: No results for input(s): GLUCAP in the last 168 hours. Lipid Profile: No results for input(s): CHOL, HDL, LDLCALC, TRIG, CHOLHDL, LDLDIRECT in the last 72 hours. Thyroid Function Tests: Recent Labs    01/07/21 0412  TSH 2.414   Anemia Panel: Recent Labs    01/06/21 0449  VITAMINB12 463    Sepsis Labs: Recent Labs  Lab 01/02/21 2322 01/03/21 0153  LATICACIDVEN 1.4 0.9    Recent Results (from the past 240 hour(s))  Resp Panel by RT-PCR (Flu A&B, Covid) Nasopharyngeal Swab     Status: None   Collection Time: 01/02/21  3:08 PM   Specimen: Nasopharyngeal Swab; Nasopharyngeal(NP) swabs in vial transport medium  Result Value Ref Range Status   SARS Coronavirus 2 by RT PCR NEGATIVE NEGATIVE Final    Comment: (NOTE) SARS-CoV-2 target nucleic acids are NOT DETECTED.  The SARS-CoV-2 RNA is generally detectable in upper respiratory specimens during the acute phase of infection. The lowest concentration of SARS-CoV-2 viral copies this assay can detect is 138 copies/mL. A negative result does not preclude SARS-Cov-2 infection and should not be used as the sole basis for treatment or other patient management decisions. A negative  result may occur with  improper specimen collection/handling, submission of specimen other than nasopharyngeal swab, presence of viral mutation(s) within the areas targeted by this assay, and inadequate number of viral copies(<138 copies/mL). A negative result must be combined with clinical observations, patient history, and epidemiological information. The expected result is Negative.  Fact Sheet for Patients:  EntrepreneurPulse.com.au  Fact Sheet for Healthcare Providers:  IncredibleEmployment.be  This test is no t yet approved or cleared by the [place] FDA and  has been authorized for detection and/or diagnosis of SARS-CoV-2 by FDA under an Emergency Use Authorization (EUA). This EUA will remain  in effect (meaning this test can be used) for the duration of the COVID-19 declaration under Section 564(b)(1) of the Act, 21 U.S.C.section 360bbb-3(b)(1), unless the authorization is terminated  or revoked sooner.       Influenza A by PCR NEGATIVE NEGATIVE Final   Influenza B by PCR NEGATIVE NEGATIVE  Final    Comment: (NOTE) The Xpert Xpress SARS-CoV-2/FLU/RSV plus assay is intended as an aid in the diagnosis of influenza from Nasopharyngeal swab specimens and should not be used as a sole basis for treatment. Nasal washings and aspirates are unacceptable for Xpert Xpress SARS-CoV-2/FLU/RSV testing.  Fact Sheet for Patients: EntrepreneurPulse.com.au  Fact Sheet for Healthcare Providers: IncredibleEmployment.be  This test is not yet approved or cleared by the [place] FDA and has been authorized for detection and/or diagnosis of SARS-CoV-2 by FDA under an Emergency Use Authorization (EUA). This EUA will remain in effect (meaning this test can be used) for the duration of the COVID-19 declaration under Section 564(b)(1) of the Act, 21 U.S.C. section 360bbb-3(b)(1), unless the authorization is terminated or revoked.  Performed at [facility], [address] [place], [place] [address]          Radiology Studies: US Abdomen Limited  Result Date: 01/07/2021 CLINICAL DATA:  Abdominal distension EXAM: LIMITED ABDOMEN ULTRASOUND FOR ASCITES TECHNIQUE: Limited ultrasound survey for ascites was performed in all four abdominal quadrants. COMPARISON:  None. FINDINGS: No evident ascites.  Peristalsing bowel noted. Spleen noted to be enlarged measuring 16.1 x 17.0 x 6.6 cm with a measured splenic volume of 945 cubic cm. No splenic lesions evident. No perisplenic fluid or adenopathy. IMPRESSION: No evident ascites.  Splenomegaly. Electronically Signed   By: Lowella Grip III M.D.   On: 01/07/2021 13:28   ECHOCARDIOGRAM COMPLETE  Result Date: 01/07/2021    ECHOCARDIOGRAM REPORT   Patient Name:   Harry Strong Date of Exam: 01/06/2021 Medical Rec #:  [id number]        Height:       69.0 in Accession #:    [id number]       Weight:       180.0 lb Date of Birth:  09-Jul-1935         BSA:          1.976 m Patient Age:    86 years         BP:            118/62 mmHg Patient Gender: M                HR:           80 bpm. Exam Location:  [facility] Procedure: 2D Echo, Cardiac Doppler and Color Doppler Indications:     R01.1 Murmur  History:         Patient has no prior history of Echocardiogram examinations.  Sonographer:  Wilford Sports Rodgers-Jones Referring Phys:  [id number] [facility] AMERY Diagnosing Phys: Harrell Gave End MD IMPRESSIONS  1. Left ventricular ejection fraction, by estimation, is 60 to 65%. The left ventricle has normal function. The left ventricle has no regional wall motion abnormalities. Left ventricular diastolic parameters are consistent with Grade I diastolic dysfunction (impaired relaxation).  2. Right ventricular systolic function is normal. The right ventricular size is mildly enlarged. There is moderately elevated pulmonary artery systolic pressure.  3. Right atrial size was mildly dilated.  4. The mitral valve is degenerative. Mild to moderate mitral valve regurgitation. No evidence of mitral stenosis.  5. Tricuspid valve regurgitation is mild to moderate.  6. The aortic valve is tricuspid. There is moderate calcification of the aortic valve. There is severe thickening of the aortic valve. Aortic valve regurgitation is not visualized. Moderate aortic valve stenosis. Aortic valve mean gradient measures 21.0  mmHg.  7. Aortic dilatation noted. There is borderline dilatation of the aortic root, measuring 38 mm. There is mild dilatation of the ascending aorta, measuring 43 mm.  8. The inferior vena cava is normal in size with <50% respiratory variability, suggesting right atrial pressure of 8 mmHg. FINDINGS  Left Ventricle: Left ventricular ejection fraction, by estimation, is 60 to 65%. The left ventricle has normal function. The left ventricle has no regional wall motion abnormalities. The left ventricular internal cavity size was normal in size. There is  no left ventricular hypertrophy. Left ventricular diastolic parameters are consistent with  Grade I diastolic dysfunction (impaired relaxation). Right Ventricle: The right ventricular size is mildly enlarged. No increase in right ventricular wall thickness. Right ventricular systolic function is normal. There is moderately elevated pulmonary artery systolic pressure. The tricuspid regurgitant velocity is 3.08 m/s, and with an assumed right atrial pressure of 8 mmHg, the estimated right ventricular systolic pressure is 79.3 mmHg. Left Atrium: Left atrial size was normal in size. Right Atrium: Right atrial size was mildly dilated. Pericardium: There is no evidence of pericardial effusion. Mitral Valve: The mitral valve is degenerative in appearance. There is mild thickening of the mitral valve leaflet(s). Mild to moderate mitral annular calcification. Mild to moderate mitral valve regurgitation. No evidence of mitral valve stenosis. Tricuspid Valve: The tricuspid valve is normal in structure. Tricuspid valve regurgitation is mild to moderate. Aortic Valve: The aortic valve is tricuspid. There is moderate calcification of the aortic valve. There is severe thickening of the aortic valve. Aortic valve regurgitation is not visualized. Moderate aortic stenosis is present. Aortic valve mean gradient measures 21.0 mmHg. Aortic valve peak gradient measures 31.6 mmHg. Aortic valve area, by VTI measures 1.57 cm. Pulmonic Valve: The pulmonic valve was not well visualized. Pulmonic valve regurgitation is not visualized. No evidence of pulmonic stenosis. Aorta: Aortic dilatation noted. There is borderline dilatation of the aortic root, measuring 38 mm. There is mild dilatation of the ascending aorta, measuring 43 mm. Pulmonary Artery: The pulmonary artery is not well seen. Venous: The inferior vena cava is normal in size with less than 50% respiratory variability, suggesting right atrial pressure of 8 mmHg. IAS/Shunts: The interatrial septum was not well visualized.  LEFT VENTRICLE PLAX 2D LVIDd:         4.48 cm   Diastology LVIDs:         2.14 cm  LV e' medial:    8.45 cm/s LV PW:         0.97 cm  LV E/e' medial:  12.5 LV IVS:  0.82 cm  LV e' lateral:   8.21 cm/s LVOT diam:     2.20 cm  LV E/e' lateral: 12.9 LV SV:         79 LV SV Index:   40 LVOT Area:     3.80 cm  RIGHT VENTRICLE RV Basal diam:  4.38 cm RV S prime:     18.30 cm/s TAPSE (M-mode): 2.4 cm LEFT ATRIUM             Index       RIGHT ATRIUM           Index LA diam:        4.10 cm 2.08 cm/m  RA Area:     17.70 cm LA Vol (A2C):   82.6 ml 41.81 ml/m RA Volume:   60.30 ml  30.52 ml/m LA Vol (A4C):   38.2 ml 19.34 ml/m LA Biplane Vol: 60.1 ml 30.42 ml/m  AORTIC VALVE AV Area (Vmax):    1.47 cm AV Area (Vmean):   1.37 cm AV Area (VTI):     1.57 cm AV Vmax:           281.00 cm/s AV Vmean:          206.400 cm/s AV VTI:            0.503 m AV Peak Grad:      31.6 mmHg AV Mean Grad:      21.0 mmHg LVOT Vmax:         108.50 cm/s LVOT Vmean:        74.250 cm/s LVOT VTI:          0.208 m LVOT/AV VTI ratio: 0.41  AORTA Ao Root diam: 3.80 cm Ao Asc diam:  4.30 cm MITRAL VALVE                TRICUSPID VALVE MV Area (PHT): 3.48 cm     TR Peak grad:   37.9 mmHg MV Decel Time: 218 msec     TR Vmax:        308.00 cm/s MR Peak grad: 134.6 mmHg MR Vmax:      580.00 cm/s   SHUNTS MV E velocity: 106.00 cm/s  Systemic VTI:  0.21 m MV A velocity: 145.00 cm/s  Systemic Diam: 2.20 cm MV E/A ratio:  0.73 Harrell Gave End MD Electronically signed by Nelva Bush MD Signature Date/Time: 01/07/2021/8:35:45 AM    Final         Scheduled Meds: . (feeding supplement) PROSource Plus  30 mL Oral Daily  . allopurinol  300 mg Oral Daily  . azithromycin  500 mg Oral Daily  . dextromethorphan-guaiFENesin  1 tablet Oral BID  . feeding supplement  237 mL Oral BID BM  . ferrous sulfate  325 mg Oral Daily  . latanoprost  1 drop Both Eyes QHS  . magnesium gluconate  500 mg Oral Daily  . mouth rinse  15 mL Mouth Rinse BID  . midodrine  2.5 mg Oral TID WC  . tamsulosin  0.4  mg Oral Daily  . vitamin B-12  1,000 mcg Oral Daily   Continuous Infusions: . cefTRIAXone (ROCEPHIN)  IV       LOS: 5 days    Time spent: 35 minutes,     Kamarie Veno A Jacora Hopkins, MD [facility]   If 7PM-7AM, please contact night-coverage [email or web address]  01/07/2021, 3:01 PM

## 2021-01-07 NOTE — Progress Notes (Signed)
[facility]  ROUNDING NOTE   Subjective:   Harry Strong is a 86 y.o. male who presents to the ED with shortness of breath and productive cough. He states he had a fever for a few days prior. He has an medical history of B-cell lymphoma, BPH, gout and pancytopenia.   Patient seen sitting at the side of bed Tolerated breakfast, ate 50% Denies nausea and diarrhea Denies shortness of breath Complains of cramping in hands and leg intermittently Says he usually drinks water to ease cramping, but is now on a fluid restriction.   Objective:  Vital signs in last 24 hours:  Temp:  [98.2 F (36.8 C)-99.8 F (37.7 C)] 98.4 F (36.9 C) (05/18 0851) Pulse Rate:  [70-81] 75 (05/18 0851) Resp:  [16-18] 18 (05/18 0851) BP: (104-119)/(52-62) 104/58 (05/18 0851) SpO2:  [91 %-98 %] 97 % (05/18 0851)  Weight change:  Filed Weights   01/02/21 1352  Weight: 81.6 kg    Intake/Output: I/O last 3 completed shifts: In: 21 [P.O.:720] Out: 2650 [Urine:2650]   Intake/Output this shift:  Total I/O In: 360 [P.O.:360] Out: -   Physical Exam: General: NAD, sitting at bedside  Head: Normocephalic, atraumatic. Moist oral mucosal membranes  Eyes: Anicteric  Lungs:  Clear to auscultation, normal breathing effort  Heart: Regular rate and rhythm, systolic murmur  Abdomen:  Soft, nontender, distended  Extremities:  No peripheral edema.  Neurologic: Nonfocal, moving all four extremities  Skin: No lesions or rashes       Basic Metabolic Panel: Recent Labs  Lab 01/02/21 1404 01/03/21 0153 01/04/21 0458 01/05/21 0834 01/06/21 0449 01/07/21 0412  NA 127* 127* 131* 129* 128* 130*  K 3.7 3.8 3.9 3.8  --  5.0  CL 97* 98 101 98  --  96*  CO2 21* 22 23 23   --  25  GLUCOSE 129* 104* 116* 216*  --  142*  BUN 18 19 17 16   --  21  CREATININE 1.15 1.08 1.06 1.07  --  1.03  CALCIUM 8.4* 8.1* 7.9* 8.2*  --  8.5*    Liver Function Tests: Recent Labs  Lab 01/02/21 1404  AST 24   ALT 10  ALKPHOS 62  BILITOT 0.7  PROT 5.8*  ALBUMIN 3.0*   No results for input(s): LIPASE, AMYLASE in the last 168 hours. No results for input(s): AMMONIA in the last 168 hours.  CBC: Recent Labs  Lab 01/02/21 1404 01/03/21 0153 01/04/21 0458 01/05/21 0834 01/06/21 0449  WBC 1.7* 1.3* 1.0* 1.5* 1.6*  NEUTROABS 1.4*  --   --   --   --   HGB 9.9* 9.4* 8.8* 9.8* 9.3*  HCT 28.6* 27.4* 26.3* 28.5* 27.2*  MCV 79.4* 80.4 80.9 81.2 81.4  PLT 118* 94* 86* 121* 117*    Cardiac Enzymes: No results for input(s): CKTOTAL, CKMB, CKMBINDEX, TROPONINI in the last 168 hours.  BNP: Invalid input(s): POCBNP  CBG: No results for input(s): GLUCAP in the last 168 hours.  Microbiology: Results for orders placed or performed during the hospital encounter of 01/02/21  Resp Panel by RT-PCR (Flu A&B, Covid) Nasopharyngeal Swab     Status: None   Collection Time: 01/02/21  3:08 PM   Specimen: Nasopharyngeal Swab; Nasopharyngeal(NP) swabs in vial transport medium  Result Value Ref Range Status   SARS Coronavirus 2 by RT PCR NEGATIVE NEGATIVE Final    Comment: (NOTE) SARS-CoV-2 target nucleic acids are NOT DETECTED.  The SARS-CoV-2 RNA is generally detectable in upper  respiratory specimens during the acute phase of infection. The lowest concentration of SARS-CoV-2 viral copies this assay can detect is 138 copies/mL. A negative result does not preclude SARS-Cov-2 infection and should not be used as the sole basis for treatment or other patient management decisions. A negative result may occur with  improper specimen collection/handling, submission of specimen other than nasopharyngeal swab, presence of viral mutation(s) within the areas targeted by this assay, and inadequate number of viral copies(<138 copies/mL). A negative result must be combined with clinical observations, patient history, and epidemiological information. The expected result is Negative.  Fact Sheet for Patients:   EntrepreneurPulse.com.au  Fact Sheet for Healthcare Providers:  IncredibleEmployment.be  This test is no t yet approved or cleared by the [place] FDA and  has been authorized for detection and/or diagnosis of SARS-CoV-2 by FDA under an Emergency Use Authorization (EUA). This EUA will remain  in effect (meaning this test can be used) for the duration of the COVID-19 declaration under Section 564(b)(1) of the Act, 21 U.S.C.section 360bbb-3(b)(1), unless the authorization is terminated  or revoked sooner.       Influenza A by PCR NEGATIVE NEGATIVE Final   Influenza B by PCR NEGATIVE NEGATIVE Final    Comment: (NOTE) The Xpert Xpress SARS-CoV-2/FLU/RSV plus assay is intended as an aid in the diagnosis of influenza from Nasopharyngeal swab specimens and should not be used as a sole basis for treatment. Nasal washings and aspirates are unacceptable for Xpert Xpress SARS-CoV-2/FLU/RSV testing.  Fact Sheet for Patients: EntrepreneurPulse.com.au  Fact Sheet for Healthcare Providers: IncredibleEmployment.be  This test is not yet approved or cleared by the [place] FDA and has been authorized for detection and/or diagnosis of SARS-CoV-2 by FDA under an Emergency Use Authorization (EUA). This EUA will remain in effect (meaning this test can be used) for the duration of the COVID-19 declaration under Section 564(b)(1) of the Act, 21 U.S.C. section 360bbb-3(b)(1), unless the authorization is terminated or revoked.  Performed at [facility], [address] [place], [place] [address]     Coagulation Studies: No results for input(s): LABPROT, INR in the last 72 hours.  Urinalysis: No results for input(s): COLORURINE, LABSPEC, PHURINE, GLUCOSEU, HGBUR, BILIRUBINUR, KETONESUR, PROTEINUR, UROBILINOGEN, NITRITE, LEUKOCYTESUR in the last 72 hours.  Invalid input(s): APPERANCEUR     Imaging: ECHOCARDIOGRAM COMPLETE  Result Date: 01/07/2021    ECHOCARDIOGRAM REPORT   Patient Name:   Harry Strong Date of Exam: 01/06/2021 Medical Rec #:  [id number]        Height:       69.0 in Accession #:    [id number]       Weight:       180.0 lb Date of Birth:  [birth year]         BSA:          1.976 m Patient Age:    54 years         BP:           118/62 mmHg Patient Gender: M                HR:           80 bpm. Exam Location:  [facility] Procedure: 2D Echo, Cardiac Doppler and Color Doppler Indications:     R01.1 Murmur  History:         Patient has no prior history of Echocardiogram examinations.  Sonographer:     Wilford Sports Rodgers-Jones Referring Phys:  [id number] [place] Diagnosing  Phys: Yvonne Kendall MD IMPRESSIONS  1. Left ventricular ejection fraction, by estimation, is 60 to 65%. The left ventricle has normal function. The left ventricle has no regional wall motion abnormalities. Left ventricular diastolic parameters are consistent with Grade I diastolic dysfunction (impaired relaxation).  2. Right ventricular systolic function is normal. The right ventricular size is mildly enlarged. There is moderately elevated pulmonary artery systolic pressure.  3. Right atrial size was mildly dilated.  4. The mitral valve is degenerative. Mild to moderate mitral valve regurgitation. No evidence of mitral stenosis.  5. Tricuspid valve regurgitation is mild to moderate.  6. The aortic valve is tricuspid. There is moderate calcification of the aortic valve. There is severe thickening of the aortic valve. Aortic valve regurgitation is not visualized. Moderate aortic valve stenosis. Aortic valve mean gradient measures 21.0  mmHg.  7. Aortic dilatation noted. There is borderline dilatation of the aortic root, measuring 38 mm. There is mild dilatation of the ascending aorta, measuring 43 mm.  8. The inferior vena cava is normal in size with <50% respiratory variability, suggesting right atrial pressure of 8 mmHg.  FINDINGS  Left Ventricle: Left ventricular ejection fraction, by estimation, is 60 to 65%. The left ventricle has normal function. The left ventricle has no regional wall motion abnormalities. The left ventricular internal cavity size was normal in size. There is  no left ventricular hypertrophy. Left ventricular diastolic parameters are consistent with Grade I diastolic dysfunction (impaired relaxation). Right Ventricle: The right ventricular size is mildly enlarged. No increase in right ventricular wall thickness. Right ventricular systolic function is normal. There is moderately elevated pulmonary artery systolic pressure. The tricuspid regurgitant velocity is 3.08 m/s, and with an assumed right atrial pressure of 8 mmHg, the estimated right ventricular systolic pressure is 45.9 mmHg. Left Atrium: Left atrial size was normal in size. Right Atrium: Right atrial size was mildly dilated. Pericardium: There is no evidence of pericardial effusion. Mitral Valve: The mitral valve is degenerative in appearance. There is mild thickening of the mitral valve leaflet(s). Mild to moderate mitral annular calcification. Mild to moderate mitral valve regurgitation. No evidence of mitral valve stenosis. Tricuspid Valve: The tricuspid valve is normal in structure. Tricuspid valve regurgitation is mild to moderate. Aortic Valve: The aortic valve is tricuspid. There is moderate calcification of the aortic valve. There is severe thickening of the aortic valve. Aortic valve regurgitation is not visualized. Moderate aortic stenosis is present. Aortic valve mean gradient measures 21.0 mmHg. Aortic valve peak gradient measures 31.6 mmHg. Aortic valve area, by VTI measures 1.57 cm. Pulmonic Valve: The pulmonic valve was not well visualized. Pulmonic valve regurgitation is not visualized. No evidence of pulmonic stenosis. Aorta: Aortic dilatation noted. There is borderline dilatation of the aortic root, measuring 38 mm. There is mild  dilatation of the ascending aorta, measuring 43 mm. Pulmonary Artery: The pulmonary artery is not well seen. Venous: The inferior vena cava is normal in size with less than 50% respiratory variability, suggesting right atrial pressure of 8 mmHg. IAS/Shunts: The interatrial septum was not well visualized.  LEFT VENTRICLE PLAX 2D LVIDd:         4.48 cm  Diastology LVIDs:         2.14 cm  LV e' medial:    8.45 cm/s LV PW:         0.97 cm  LV E/e' medial:  12.5 LV IVS:        0.82 cm  LV e' lateral:  8.21 cm/s LVOT diam:     2.20 cm  LV E/e' lateral: 12.9 LV SV:         79 LV SV Index:   40 LVOT Area:     3.80 cm  RIGHT VENTRICLE RV Basal diam:  4.38 cm RV S prime:     18.30 cm/s TAPSE (M-mode): 2.4 cm LEFT ATRIUM             Index       RIGHT ATRIUM           Index LA diam:        4.10 cm 2.08 cm/m  RA Area:     17.70 cm LA Vol (A2C):   82.6 ml 41.81 ml/m RA Volume:   60.30 ml  30.52 ml/m LA Vol (A4C):   38.2 ml 19.34 ml/m LA Biplane Vol: 60.1 ml 30.42 ml/m  AORTIC VALVE AV Area (Vmax):    1.47 cm AV Area (Vmean):   1.37 cm AV Area (VTI):     1.57 cm AV Vmax:           281.00 cm/s AV Vmean:          206.400 cm/s AV VTI:            0.503 m AV Peak Grad:      31.6 mmHg AV Mean Grad:      21.0 mmHg LVOT Vmax:         108.50 cm/s LVOT Vmean:        74.250 cm/s LVOT VTI:          0.208 m LVOT/AV VTI ratio: 0.41  AORTA Ao Root diam: 3.80 cm Ao Asc diam:  4.30 cm MITRAL VALVE                TRICUSPID VALVE MV Area (PHT): 3.48 cm     TR Peak grad:   37.9 mmHg MV Decel Time: 218 msec     TR Vmax:        308.00 cm/s MR Peak grad: 134.6 mmHg MR Vmax:      580.00 cm/s   SHUNTS MV E velocity: 106.00 cm/s  Systemic VTI:  0.21 m MV A velocity: 145.00 cm/s  Systemic Diam: 2.20 cm MV E/A ratio:  0.73 Harrell Gave End MD Electronically signed by Nelva Bush MD Signature Date/Time: 01/07/2021/8:35:45 AM    Final      Medications:   . cefTRIAXone (ROCEPHIN)  IV     . (feeding supplement) PROSource Plus  30 mL Oral  Daily  . allopurinol  300 mg Oral Daily  . azithromycin  500 mg Oral Daily  . dextromethorphan-guaiFENesin  1 tablet Oral BID  . feeding supplement  237 mL Oral BID BM  . ferrous sulfate  325 mg Oral Daily  . latanoprost  1 drop Both Eyes QHS  . magnesium gluconate  500 mg Oral Daily  . mouth rinse  15 mL Mouth Rinse BID  . midodrine  2.5 mg Oral TID WC  . tamsulosin  0.4 mg Oral Daily  . vitamin B-12  1,000 mcg Oral Daily   albuterol, dextromethorphan-guaiFENesin, magnesium hydroxide, ondansetron **OR** ondansetron (ZOFRAN) IV, traZODone  Assessment/ Plan:  Mr. Breckin Savannah is a 86 y.o.  male who presents to the ED with shortness of breath and productive cough. He states he had a fever for a few days prior. He has an medical history of B-cell lymphoma, BPH, gout and pancytopenia.   1. 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 Ascites vs urinary retention Abdominal US pending Adequate urine output     LOS: 5 Briana Newman 5/18/202210:54 AM

## 2021-01-07 NOTE — Plan of Care (Signed)

## 2021-01-07 NOTE — Care Management Important Message (Signed)
Important Message  Patient Details  Name: Harry Strong MRN: [id number] Date of Birth: 09/01/1934   Medicare Important Message Given:  Yes     Juliann Pulse A Orla Estrin 01/07/2021, 10:21 AM

## 2021-01-08 DIAGNOSIS — D61818 Other pancytopenia: Secondary | ICD-10-CM | POA: Insufficient documentation

## 2021-01-08 LAB — BASIC METABOLIC PANEL
Anion gap: 10 (ref 5–15)
BUN: 21 mg/dL (ref 8–23)
CO2: 24 mmol/L (ref 22–32)
Calcium: 8.6 mg/dL — ABNORMAL LOW (ref 8.9–10.3)
Chloride: 96 mmol/L — ABNORMAL LOW (ref 98–111)
Creatinine, Ser: 0.97 mg/dL (ref 0.61–1.24)
GFR, Estimated: >60 mL/min (ref >60)
Glucose, Bld: 142 mg/dL — ABNORMAL HIGH (ref 70–99)
Potassium: 4.4 mmol/L (ref 3.5–5.1)
Sodium: 130 mmol/L — ABNORMAL LOW (ref 135–145)

## 2021-01-08 MED ORDER — MIDODRINE HCL 2.5 MG PO TABS: 2.5000 mg | ORAL_TABLET | Freq: Three times a day (TID) | ORAL | 1 refills | Status: AC

## 2021-01-08 MED ORDER — ALLOPURINOL 300 MG PO TABS: 300.0000 mg | ORAL_TABLET | Freq: Every day | ORAL | 1 refills | Status: AC

## 2021-01-08 MED ORDER — DM-GUAIFENESIN ER 30-600 MG PO TB12: 1.0000 | ORAL_TABLET | Freq: Two times a day (BID) | ORAL | 0 refills | Status: AC

## 2021-01-08 MED ORDER — CEFDINIR 300 MG PO CAPS
300.0000 mg | ORAL_CAPSULE | Freq: Two times a day (BID) | ORAL | Status: DC
  Filled 2021-01-08: qty 1

## 2021-01-08 MED ORDER — CEFDINIR 300 MG PO CAPS
300.0000 mg | ORAL_CAPSULE | Freq: Two times a day (BID) | ORAL | Status: DC
  Administered 2021-01-08: 300 mg via ORAL
  Filled 2021-01-08 (×2): qty 1

## 2021-01-08 MED ORDER — AZITHROMYCIN 500 MG PO TABS: 500.0000 mg | ORAL_TABLET | Freq: Every day | ORAL | 0 refills | Status: AC

## 2021-01-08 MED ORDER — TAMSULOSIN HCL 0.4 MG PO CAPS: 0.4000 mg | ORAL_CAPSULE | Freq: Every day | ORAL | 2 refills | Status: AC

## 2021-01-08 MED ORDER — MAGNESIUM GLUCONATE 500 MG PO TABS: 500.0000 mg | ORAL_TABLET | Freq: Every day | ORAL | 0 refills | Status: AC

## 2021-01-08 MED ORDER — CEFDINIR 300 MG PO CAPS: 300.0000 mg | ORAL_CAPSULE | Freq: Two times a day (BID) | ORAL | 0 refills | Status: DC

## 2021-01-08 MED ORDER — ALBUTEROL SULFATE HFA 108 (90 BASE) MCG/ACT IN AERS: 2.0000 | INHALATION_SPRAY | Freq: Four times a day (QID) | RESPIRATORY_TRACT | 0 refills | Status: AC | PRN

## 2021-01-08 NOTE — Progress Notes (Deleted)
[place]  [phone] [phone]) [phone]  ID: Harry Strong OB: 1934/09/12  MR#: [id number]  URK#:[id number]  Patient Care Team: Mechele Claude, FNP as PCP - General (Family Medicine) System, Provider Not In as PCP - Hematology/Oncology (Hematology and Oncology)  CHIEF COMPLAINT: Pancytopenia  INTERVAL HISTORY: ***  REVIEW OF SYSTEMS:   ROS  As per HPI. Otherwise, a complete review of systems is negative.  PAST MEDICAL HISTORY: No past medical history on file.  PAST SURGICAL HISTORY: No past surgical history on file.  FAMILY HISTORY: No family history on file.  ADVANCED DIRECTIVES (Y/N):  N  HEALTH MAINTENANCE: Social History   Tobacco Use  . Smoking status: Never Smoker  . Smokeless tobacco: Never Used  Substance Use Topics  . Alcohol use: Not Currently  . Drug use: Never     Colonoscopy:  PAP:  Bone density:  Lipid panel:  Allergies  Allergen Reactions  . Aspirin Swelling  . Tylenol [Acetaminophen] Other (See Comments)    Red face, tongue, and lips    Current Outpatient Medications  Medication Sig Dispense Refill  . albuterol (VENTOLIN HFA) 108 (90 Base) MCG/ACT inhaler Inhale 2 puffs into the lungs every 6 (six) hours as needed for wheezing or shortness of breath. 1 each 0  . allopurinol (ZYLOPRIM) 300 MG tablet Take 1 tablet (300 mg total) by mouth daily. 30 tablet 1  . azithromycin (ZITHROMAX) 500 MG tablet Take 1 tablet (500 mg total) by mouth daily for 1 day. 1 tablet 0  . cefdinir (OMNICEF) 300 MG capsule Take 1 capsule (300 mg total) by mouth every 12 (twelve) hours. 4 capsule 0  . dextromethorphan-guaiFENesin (MUCINEX DM) 30-600 MG 12hr tablet Take 1 tablet by mouth 2 (two) times daily. 30 tablet 0  . ferrous sulfate 325 (65 FE) MG tablet Take 1 mg by mouth daily.    Marland Kitchen latanoprost (XALATAN) 0.005 % ophthalmic solution Place 1 drop into both eyes at bedtime.    . magnesium gluconate (MAGONATE) 500 MG tablet  Take 1 tablet (500 mg total) by mouth daily. 30 tablet 0  . midodrine (PROAMATINE) 2.5 MG tablet Take 1 tablet (2.5 mg total) by mouth 3 (three) times daily with meals. 90 tablet 1  . tamsulosin (FLOMAX) 0.4 MG CAPS capsule Take 1 capsule (0.4 mg total) by mouth daily. 30 capsule 2  . vitamin B-12 (CYANOCOBALAMIN) 1000 MCG tablet Take 1,000 mcg by mouth daily.     No current facility-administered medications for this visit.   Facility-Administered Medications Ordered in Other Visits  Medication Dose Route Frequency Provider Last Rate Last Admin  . (feeding supplement) PROSource Plus liquid 30 mL  30 mL Oral Daily Nolberto Hanlon, MD   30 mL at 01/08/21 1333  . albuterol (VENTOLIN HFA) 108 (90 Base) MCG/ACT inhaler 2 puff  2 puff Inhalation Q6H PRN Nolberto Hanlon, MD      . allopurinol (ZYLOPRIM) tablet 300 mg  300 mg Oral Daily Mansy, Jan A, MD   300 mg at 01/08/21 0904  . azithromycin (ZITHROMAX) tablet 500 mg  500 mg Oral Daily Regalado, Belkys A, MD   500 mg at 01/07/21 1637  . cefdinir (OMNICEF) capsule 300 mg  300 mg Oral Q12H Regalado, Belkys A, MD   300 mg at 01/08/21 1237  . dextromethorphan-guaiFENesin (MUCINEX DM) 30-600 MG per 12 hr tablet 1 tablet  1 tablet Oral BID Mansy, Arvella Merles, MD   1 tablet at 01/08/21 0903  . dextromethorphan-guaiFENesin [facility] DM)  30-600 MG per 12 hr tablet 1 tablet  1 tablet Oral BID PRN Mansy, Arvella Merles, MD   1 tablet at 01/02/21 1711  . feeding supplement (ENSURE ENLIVE / ENSURE PLUS) liquid 237 mL  237 mL Oral BID BM Nolberto Hanlon, MD   237 mL at 01/08/21 0905  . ferrous sulfate tablet 325 mg  325 mg Oral Daily Mansy, Jan A, MD   325 mg at 01/08/21 0904  . latanoprost (XALATAN) 0.005 % ophthalmic solution 1 drop  1 drop Both Eyes QHS Mansy, Jan A, MD   1 drop at 01/07/21 2139  . magnesium gluconate (MAGONATE) tablet 500 mg  500 mg Oral Daily Mansy, Jan A, MD   500 mg at 01/08/21 0904  . magnesium hydroxide (MILK OF MAGNESIA) suspension 30 mL  30 mL Oral Daily PRN  Mansy, Jan A, MD      . MEDLINE mouth rinse  15 mL Mouth Rinse BID Nolberto Hanlon, MD   15 mL at 01/08/21 0906  . midodrine (PROAMATINE) tablet 2.5 mg  2.5 mg Oral TID WC Nolberto Hanlon, MD   2.5 mg at 01/08/21 1237  . ondansetron (ZOFRAN) tablet 4 mg  4 mg Oral Q6H PRN Mansy, Jan A, MD       Or  . ondansetron [facility]) injection 4 mg  4 mg Intravenous Q6H PRN Mansy, Jan A, MD      . tamsulosin [facility]) capsule 0.4 mg  0.4 mg Oral Daily Mansy, Jan A, MD   0.4 mg at 01/08/21 0904  . traZODone (DESYREL) tablet 25 mg  25 mg Oral QHS PRN Mansy, Jan A, MD   25 mg at 01/07/21 2146  . vitamin B-12 (CYANOCOBALAMIN) tablet 1,000 mcg  1,000 mcg Oral Daily Mansy, Jan A, MD   1,000 mcg at 01/08/21 3220    OBJECTIVE: There were no vitals filed for this visit.   There is no height or weight on file to calculate BMI.    ECOG FS:{CHL ONC [id number]  General: Well-developed, well-nourished, no acute distress. Eyes: Pink conjunctiva, anicteric sclera. HEENT: Normocephalic, moist mucous membranes. Lungs: No audible wheezing or coughing. Heart: Regular rate and rhythm. Abdomen: Soft, nontender, no obvious distention. Musculoskeletal: No edema, cyanosis, or clubbing. Neuro: Alert, answering all questions appropriately. Cranial nerves grossly intact. Skin: No rashes or petechiae noted. Psych: Normal affect. Lymphatics: No cervical, calvicular, axillary or inguinal LAD.   LAB RESULTS:  Lab Results  Component Value Date   NA 130 (L) 01/08/2021   K 4.4 01/08/2021   CL 96 (L) 01/08/2021   CO2 24 01/08/2021   GLUCOSE 142 (H) 01/08/2021   BUN 21 01/08/2021   CREATININE 0.97 01/08/2021   CALCIUM 8.6 (L) 01/08/2021   PROT 5.8 (L) 01/02/2021   ALBUMIN 3.0 (L) 01/02/2021   AST 24 01/02/2021   ALT 10 01/02/2021   ALKPHOS 62 01/02/2021   BILITOT 0.7 01/02/2021   GFRNONAA >60 01/08/2021    Lab Results  Component Value Date   WBC 1.6 (L) 01/06/2021   NEUTROABS 1.4 (L) 01/02/2021   HGB 9.3 (L)  01/06/2021   HCT 27.2 (L) 01/06/2021   MCV 81.4 01/06/2021   PLT 117 (L) 01/06/2021     STUDIES: CT Chest Wo Contrast  Result Date: 01/02/2021 CLINICAL DATA:  Cough, lung nodules, generalized weakness, shortness of breath EXAM: CT CHEST WITHOUT CONTRAST TECHNIQUE: Multidetector CT imaging of the chest was performed following the standard protocol without IV contrast. Sagittal and coronal MPR images reconstructed from  axial data set. COMPARISON:  Chest radiograph 01/02/2021 FINDINGS: Cardiovascular: Atherosclerotic calcifications aorta, proximal great vessels and coronary arteries. Upper normal caliber ascending thoracic aorta 3.9 cm diameter. Mitral annular and aortic valvular calcifications. No pericardial effusion. RIGHT jugular Port-A-Cath with tip in SVC. Mediastinum/Nodes: Esophagus unremarkable. Base of cervical region normal appearance. Few scattered normal size mediastinal lymph nodes without definite thoracic adenopathy. Lungs/Pleura: Small BILATERAL pleural effusions. Patchy predominantly peribronchial infiltrates throughout both lungs involving all lobes consistent with multifocal pneumonia. No pneumothorax. No discrete pulmonary mass. Upper Abdomen: Spleen appears enlarged, 13.6 x 8.9 cm image 145. Remaining visualized upper abdomen unremarkable. Musculoskeletal: No osseous abnormalities. Tiny subcutaneous nodule presternal 11 mm greatest diameter image 74 question epidermal inclusion cyst versus sebaceous cyst. IMPRESSION: Extensive BILATERAL patchy pulmonary infiltrates, predominantly perivascular, consistent with multifocal pneumonia. Radiographic follow-up until resolution recommended to exclude underlying pulmonary nodules. Small BILATERAL pleural effusions. Mild splenic enlargement. Scattered atherosclerotic calcifications including coronary arteries. Aortic Atherosclerosis (ICD10-I70.0). Electronically Signed   By: Lavonia Dana M.D.   On: 01/02/2021 15:55   US Abdomen Limited  Result  Date: 01/07/2021 CLINICAL DATA:  Abdominal distension EXAM: LIMITED ABDOMEN ULTRASOUND FOR ASCITES TECHNIQUE: Limited ultrasound survey for ascites was performed in all four abdominal quadrants. COMPARISON:  None. FINDINGS: No evident ascites.  Peristalsing bowel noted. Spleen noted to be enlarged measuring 16.1 x 17.0 x 6.6 cm with a measured splenic volume of 945 cubic cm. No splenic lesions evident. No perisplenic fluid or adenopathy. IMPRESSION: No evident ascites.  Splenomegaly. Electronically Signed   By: Lowella Grip III M.D.   On: 01/07/2021 13:28   DG Chest Portable 1 View  Result Date: 01/02/2021 CLINICAL DATA:  Cough and short of breath EXAM: PORTABLE CHEST 1 VIEW COMPARISON:  None. FINDINGS: Right-sided central venous port tip over the distal SVC. Widespread bilateral airspace opacities some of which are nodular in appearance. Possible small right effusion. Borderline cardiomegaly with aortic atherosclerosis. No pneumothorax. IMPRESSION: Widespread bilateral airspace opacities, some of which are nodular in appearance. Findings could be secondary to diffuse bilateral pneumonia, however given nodular appearance, the possibility of pulmonary nodules is also raised. Suggest chest CT for further evaluation. Electronically Signed   By: Donavan Foil M.D.   On: 01/02/2021 15:26   ECHOCARDIOGRAM COMPLETE  Result Date: 01/07/2021    ECHOCARDIOGRAM REPORT   Patient Name:   Harry Strong Date of Exam: 01/06/2021 Medical Rec #:  [id number]        Height:       69.0 in Accession #:    [id number]       Weight:       180.0 lb Date of Birth:  10/02/1934         BSA:          1.976 m Patient Age:    45 years         BP:           118/62 mmHg Patient Gender: M                HR:           80 bpm. Exam Location:  [facility] Procedure: 2D Echo, Cardiac Doppler and Color Doppler Indications:     R01.1 Murmur  History:         Patient has no prior history of Echocardiogram examinations.  Sonographer:     Wilford Sports  Rodgers-Jones Referring Phys:  [id number] AMERY Diagnosing Phys: Nelva Bush MD IMPRESSIONS  1. Left ventricular ejection fraction,  by estimation, is 60 to 65%. The left ventricle has normal function. The left ventricle has no regional wall motion abnormalities. Left ventricular diastolic parameters are consistent with Grade I diastolic dysfunction (impaired relaxation).  2. Right ventricular systolic function is normal. The right ventricular size is mildly enlarged. There is moderately elevated pulmonary artery systolic pressure.  3. Right atrial size was mildly dilated.  4. The mitral valve is degenerative. Mild to moderate mitral valve regurgitation. No evidence of mitral stenosis.  5. Tricuspid valve regurgitation is mild to moderate.  6. The aortic valve is tricuspid. There is moderate calcification of the aortic valve. There is severe thickening of the aortic valve. Aortic valve regurgitation is not visualized. Moderate aortic valve stenosis. Aortic valve mean gradient measures 21.0  mmHg.  7. Aortic dilatation noted. There is borderline dilatation of the aortic root, measuring 38 mm. There is mild dilatation of the ascending aorta, measuring 43 mm.  8. The inferior vena cava is normal in size with <50% respiratory variability, suggesting right atrial pressure of 8 mmHg. FINDINGS  Left Ventricle: Left ventricular ejection fraction, by estimation, is 60 to 65%. The left ventricle has normal function. The left ventricle has no regional wall motion abnormalities. The left ventricular internal cavity size was normal in size. There is  no left ventricular hypertrophy. Left ventricular diastolic parameters are consistent with Grade I diastolic dysfunction (impaired relaxation). Right Ventricle: The right ventricular size is mildly enlarged. No increase in right ventricular wall thickness. Right ventricular systolic function is normal. There is moderately elevated pulmonary artery systolic pressure. The  tricuspid regurgitant velocity is 3.08 m/s, and with an assumed right atrial pressure of 8 mmHg, the estimated right ventricular systolic pressure is 67.8 mmHg. Left Atrium: Left atrial size was normal in size. Right Atrium: Right atrial size was mildly dilated. Pericardium: There is no evidence of pericardial effusion. Mitral Valve: The mitral valve is degenerative in appearance. There is mild thickening of the mitral valve leaflet(s). Mild to moderate mitral annular calcification. Mild to moderate mitral valve regurgitation. No evidence of mitral valve stenosis. Tricuspid Valve: The tricuspid valve is normal in structure. Tricuspid valve regurgitation is mild to moderate. Aortic Valve: The aortic valve is tricuspid. There is moderate calcification of the aortic valve. There is severe thickening of the aortic valve. Aortic valve regurgitation is not visualized. Moderate aortic stenosis is present. Aortic valve mean gradient measures 21.0 mmHg. Aortic valve peak gradient measures 31.6 mmHg. Aortic valve area, by VTI measures 1.57 cm. Pulmonic Valve: The pulmonic valve was not well visualized. Pulmonic valve regurgitation is not visualized. No evidence of pulmonic stenosis. Aorta: Aortic dilatation noted. There is borderline dilatation of the aortic root, measuring 38 mm. There is mild dilatation of the ascending aorta, measuring 43 mm. Pulmonary Artery: The pulmonary artery is not well seen. Venous: The inferior vena cava is normal in size with less than 50% respiratory variability, suggesting right atrial pressure of 8 mmHg. IAS/Shunts: The interatrial septum was not well visualized.  LEFT VENTRICLE PLAX 2D LVIDd:         4.48 cm  Diastology LVIDs:         2.14 cm  LV e' medial:    8.45 cm/s LV PW:         0.97 cm  LV E/e' medial:  12.5 LV IVS:        0.82 cm  LV e' lateral:   8.21 cm/s LVOT diam:     2.20 cm  LV  E/e' lateral: 12.9 LV SV:         79 LV SV Index:   40 LVOT Area:     3.80 cm  RIGHT VENTRICLE RV  Basal diam:  4.38 cm RV S prime:     18.30 cm/s TAPSE (M-mode): 2.4 cm LEFT ATRIUM             Index       RIGHT ATRIUM           Index LA diam:        4.10 cm 2.08 cm/m  RA Area:     17.70 cm LA Vol (A2C):   82.6 ml 41.81 ml/m RA Volume:   60.30 ml  30.52 ml/m LA Vol (A4C):   38.2 ml 19.34 ml/m LA Biplane Vol: 60.1 ml 30.42 ml/m  AORTIC VALVE AV Area (Vmax):    1.47 cm AV Area (Vmean):   1.37 cm AV Area (VTI):     1.57 cm AV Vmax:           281.00 cm/s AV Vmean:          206.400 cm/s AV VTI:            0.503 m AV Peak Grad:      31.6 mmHg AV Mean Grad:      21.0 mmHg LVOT Vmax:         108.50 cm/s LVOT Vmean:        74.250 cm/s LVOT VTI:          0.208 m LVOT/AV VTI ratio: 0.41  AORTA Ao Root diam: 3.80 cm Ao Asc diam:  4.30 cm MITRAL VALVE                TRICUSPID VALVE MV Area (PHT): 3.48 cm     TR Peak grad:   37.9 mmHg MV Decel Time: 218 msec     TR Vmax:        308.00 cm/s MR Peak grad: 134.6 mmHg MR Vmax:      580.00 cm/s   SHUNTS MV E velocity: 106.00 cm/s  Systemic VTI:  0.21 m MV A velocity: 145.00 cm/s  Systemic Diam: 2.20 cm MV E/A ratio:  0.73 Harrell Gave End MD Electronically signed by Nelva Bush MD Signature Date/Time: 01/07/2021/8:35:45 AM    Final     ASSESSMENT: Pancytopenia.  PLAN:    1.  Pancytopenia:  Patient expressed understanding and was in agreement with this plan. He also understands that He can call clinic at any time with any questions, concerns, or complaints.   Cancer Staging No matching staging information was found for the patient.  Lloyd Huger, MD   01/08/2021 6:57 PM

## 2021-01-08 NOTE — TOC Progression Note (Signed)
Transition of Care [facility]) - Progression Note    Patient Details  Name: Harry Strong MRN: [id number] Date of Birth: 12-23-1934  [facility] CM/SW Contact  Shelbie Hutching, RN Phone Number: 01/08/2021, 1:58 PM  Clinical Narrative:    Oxygen has been delivered and door to door [facility] transport is being arranged at this time.     Expected Discharge Plan: [place] Barriers to Discharge: Barriers Resolved  Expected Discharge Plan and Services Expected Discharge Plan: [place]   Discharge Planning Services: CM Consult Post Acute Care Choice: [place] arrangements for the past 2 months: Single Family Home Expected Discharge Date: 01/08/21               DME Arranged: Oxygen DME Agency: [employer] Date DME Agency Contacted: 01/08/21 Time DME Agency Contacted: [id number] Representative spoke with at DME Agency: [place]: RN,PT,OT,Social Work [employer] Agency: Microbiologist ([place]) Date [place]: 01/08/21 Time [place]: [id number] Representative spoke with at [place]: [place] ([place]) Interventions    Readmission Risk Interventions No flowsheet data found.

## 2021-01-08 NOTE — Progress Notes (Signed)
[facility]  ROUNDING NOTE   Subjective:   Harry Strong is a 86 y.o. male who presents to the ED with shortness of breath and productive cough. He states he had a fever for a few days prior. He has an medical history of B-cell lymphoma, BPH, gout and pancytopenia.   Patient seen laying in bed Alert and oriented Tolerates meals, denies nausea Denies shortness of breath  Objective:  Vital signs in last 24 hours:  Temp:  [98.5 F (36.9 C)-99.4 F (37.4 C)] 99.4 F (37.4 C) (05/19 1138) Pulse Rate:  [68-78] 78 (05/19 1138) Resp:  [18-20] 18 (05/19 1138) BP: (96-110)/(51-69) 96/69 (05/19 1138) SpO2:  [97 %-99 %] 97 % (05/19 1138)  Weight change:  Filed Weights   01/02/21 1352  Weight: 81.6 kg    Intake/Output: I/O last 3 completed shifts: In: 475.2 [P.O.:360; IV Piggyback:115.2] Out: 0160 [Urine:1425]   Intake/Output this shift:  No intake/output data recorded.  Physical Exam: General: NAD, laying in bed  Head: Normocephalic, atraumatic. Moist oral mucosal membranes  Eyes: Anicteric  Lungs:  Clear to auscultation, normal breathing effort  Heart: Regular rate and rhythm, systolic murmur  Abdomen:  Soft, nontender, distended  Extremities:  No peripheral edema.  Neurologic: Alert, moving all four extremities  Skin: No lesions or rashes       Basic Metabolic Panel: Recent Labs  Lab 01/03/21 0153 01/04/21 0458 01/05/21 0834 01/06/21 0449 01/07/21 0412 01/08/21 0855  NA 127* 131* 129* 128* 130* 130*  K 3.8 3.9 3.8  --  5.0 4.4  CL 98 101 98  --  96* 96*  CO2 22 23 23   --  25 24  GLUCOSE 104* 116* 216*  --  142* 142*  BUN 19 17 16   --  21 21  CREATININE 1.08 1.06 1.07  --  1.03 0.97  CALCIUM 8.1* 7.9* 8.2*  --  8.5* 8.6*  MG  --   --   --   --  2.0  --     Liver Function Tests: Recent Labs  Lab 01/02/21 1404  AST 24  ALT 10  ALKPHOS 62  BILITOT 0.7  PROT 5.8*  ALBUMIN 3.0*   No results for input(s): LIPASE, AMYLASE in the last 168  hours. No results for input(s): AMMONIA in the last 168 hours.  CBC: Recent Labs  Lab 01/02/21 1404 01/03/21 0153 01/04/21 0458 01/05/21 0834 01/06/21 0449  WBC 1.7* 1.3* 1.0* 1.5* 1.6*  NEUTROABS 1.4*  --   --   --   --   HGB 9.9* 9.4* 8.8* 9.8* 9.3*  HCT 28.6* 27.4* 26.3* 28.5* 27.2*  MCV 79.4* 80.4 80.9 81.2 81.4  PLT 118* 94* 86* 121* 117*    Cardiac Enzymes: No results for input(s): CKTOTAL, CKMB, CKMBINDEX, TROPONINI in the last 168 hours.  BNP: Invalid input(s): POCBNP  CBG: No results for input(s): GLUCAP in the last 168 hours.  Microbiology: Results for orders placed or performed during the hospital encounter of 01/02/21  Resp Panel by RT-PCR (Flu A&B, Covid) Nasopharyngeal Swab     Status: None   Collection Time: 01/02/21  3:08 PM   Specimen: Nasopharyngeal Swab; Nasopharyngeal(NP) swabs in vial transport medium  Result Value Ref Range Status   SARS Coronavirus 2 by RT PCR NEGATIVE NEGATIVE Final    Comment: (NOTE) SARS-CoV-2 target nucleic acids are NOT DETECTED.  The SARS-CoV-2 RNA is generally detectable in upper respiratory specimens during the acute phase of infection. The lowest concentration of  SARS-CoV-2 viral copies this assay can detect is 138 copies/mL. A negative result does not preclude SARS-Cov-2 infection and should not be used as the sole basis for treatment or other patient management decisions. A negative result may occur with  improper specimen collection/handling, submission of specimen other than nasopharyngeal swab, presence of viral mutation(s) within the areas targeted by this assay, and inadequate number of viral copies(<138 copies/mL). A negative result must be combined with clinical observations, patient history, and epidemiological information. The expected result is Negative.  Fact Sheet for Patients:  EntrepreneurPulse.com.au  Fact Sheet for Healthcare Providers:   IncredibleEmployment.be  This test is no t yet approved or cleared by the [place] FDA and  has been authorized for detection and/or diagnosis of SARS-CoV-2 by FDA under an Emergency Use Authorization (EUA). This EUA will remain  in effect (meaning this test can be used) for the duration of the COVID-19 declaration under Section 564(b)(1) of the Act, 21 U.S.C.section 360bbb-3(b)(1), unless the authorization is terminated  or revoked sooner.       Influenza A by PCR NEGATIVE NEGATIVE Final   Influenza B by PCR NEGATIVE NEGATIVE Final    Comment: (NOTE) The Xpert Xpress SARS-CoV-2/FLU/RSV plus assay is intended as an aid in the diagnosis of influenza from Nasopharyngeal swab specimens and should not be used as a sole basis for treatment. Nasal washings and aspirates are unacceptable for Xpert Xpress SARS-CoV-2/FLU/RSV testing.  Fact Sheet for Patients: EntrepreneurPulse.com.au  Fact Sheet for Healthcare Providers: IncredibleEmployment.be  This test is not yet approved or cleared by the [place] FDA and has been authorized for detection and/or diagnosis of SARS-CoV-2 by FDA under an Emergency Use Authorization (EUA). This EUA will remain in effect (meaning this test can be used) for the duration of the COVID-19 declaration under Section 564(b)(1) of the Act, 21 U.S.C. section 360bbb-3(b)(1), unless the authorization is terminated or revoked.  Performed at [facility], [address] [place], [place] [address]     Coagulation Studies: No results for input(s): LABPROT, INR in the last 72 hours.  Urinalysis: No results for input(s): COLORURINE, LABSPEC, PHURINE, GLUCOSEU, HGBUR, BILIRUBINUR, KETONESUR, PROTEINUR, UROBILINOGEN, NITRITE, LEUKOCYTESUR in the last 72 hours.  Invalid input(s): APPERANCEUR    Imaging: US Abdomen Limited  Result Date: 01/07/2021 CLINICAL DATA:  Abdominal distension  EXAM: LIMITED ABDOMEN ULTRASOUND FOR ASCITES TECHNIQUE: Limited ultrasound survey for ascites was performed in all four abdominal quadrants. COMPARISON:  None. FINDINGS: No evident ascites.  Peristalsing bowel noted. Spleen noted to be enlarged measuring 16.1 x 17.0 x 6.6 cm with a measured splenic volume of 945 cubic cm. No splenic lesions evident. No perisplenic fluid or adenopathy. IMPRESSION: No evident ascites.  Splenomegaly. Electronically Signed   By: Lowella Grip III M.D.   On: 01/07/2021 13:28   ECHOCARDIOGRAM COMPLETE  Result Date: 01/07/2021    ECHOCARDIOGRAM REPORT   Patient Name:   CABELL BARAY Date of Exam: 01/06/2021 Medical Rec #:  [id number]        Height:       69.0 in Accession #:    [id number]       Weight:       180.0 lb Date of Birth:  1934/09/08         BSA:          1.976 m Patient Age:    32 years         BP:           118/62 mmHg  Patient Gender: M                HR:           80 bpm. Exam Location:  [facility] Procedure: 2D Echo, Cardiac Doppler and Color Doppler Indications:     R01.1 Murmur  History:         Patient has no prior history of Echocardiogram examinations.  Sonographer:     Wilford Sports Rodgers-Jones Referring Phys:  [id number] AMERY Diagnosing Phys: Nelva Bush MD IMPRESSIONS  1. Left ventricular ejection fraction, by estimation, is 60 to 65%. The left ventricle has normal function. The left ventricle has no regional wall motion abnormalities. Left ventricular diastolic parameters are consistent with Grade I diastolic dysfunction (impaired relaxation).  2. Right ventricular systolic function is normal. The right ventricular size is mildly enlarged. There is moderately elevated pulmonary artery systolic pressure.  3. Right atrial size was mildly dilated.  4. The mitral valve is degenerative. Mild to moderate mitral valve regurgitation. No evidence of mitral stenosis.  5. Tricuspid valve regurgitation is mild to moderate.  6. The aortic valve is tricuspid. There is  moderate calcification of the aortic valve. There is severe thickening of the aortic valve. Aortic valve regurgitation is not visualized. Moderate aortic valve stenosis. Aortic valve mean gradient measures 21.0  mmHg.  7. Aortic dilatation noted. There is borderline dilatation of the aortic root, measuring 38 mm. There is mild dilatation of the ascending aorta, measuring 43 mm.  8. The inferior vena cava is normal in size with <50% respiratory variability, suggesting right atrial pressure of 8 mmHg. FINDINGS  Left Ventricle: Left ventricular ejection fraction, by estimation, is 60 to 65%. The left ventricle has normal function. The left ventricle has no regional wall motion abnormalities. The left ventricular internal cavity size was normal in size. There is  no left ventricular hypertrophy. Left ventricular diastolic parameters are consistent with Grade I diastolic dysfunction (impaired relaxation). Right Ventricle: The right ventricular size is mildly enlarged. No increase in right ventricular wall thickness. Right ventricular systolic function is normal. There is moderately elevated pulmonary artery systolic pressure. The tricuspid regurgitant velocity is 3.08 m/s, and with an assumed right atrial pressure of 8 mmHg, the estimated right ventricular systolic pressure is 123XX123 mmHg. Left Atrium: Left atrial size was normal in size. Right Atrium: Right atrial size was mildly dilated. Pericardium: There is no evidence of pericardial effusion. Mitral Valve: The mitral valve is degenerative in appearance. There is mild thickening of the mitral valve leaflet(s). Mild to moderate mitral annular calcification. Mild to moderate mitral valve regurgitation. No evidence of mitral valve stenosis. Tricuspid Valve: The tricuspid valve is normal in structure. Tricuspid valve regurgitation is mild to moderate. Aortic Valve: The aortic valve is tricuspid. There is moderate calcification of the aortic valve. There is severe  thickening of the aortic valve. Aortic valve regurgitation is not visualized. Moderate aortic stenosis is present. Aortic valve mean gradient measures 21.0 mmHg. Aortic valve peak gradient measures 31.6 mmHg. Aortic valve area, by VTI measures 1.57 cm. Pulmonic Valve: The pulmonic valve was not well visualized. Pulmonic valve regurgitation is not visualized. No evidence of pulmonic stenosis. Aorta: Aortic dilatation noted. There is borderline dilatation of the aortic root, measuring 38 mm. There is mild dilatation of the ascending aorta, measuring 43 mm. Pulmonary Artery: The pulmonary artery is not well seen. Venous: The inferior vena cava is normal in size with less than 50% respiratory variability, suggesting right atrial pressure of  8 mmHg. IAS/Shunts: The interatrial septum was not well visualized.  LEFT VENTRICLE PLAX 2D LVIDd:         4.48 cm  Diastology LVIDs:         2.14 cm  LV e' medial:    8.45 cm/s LV PW:         0.97 cm  LV E/e' medial:  12.5 LV IVS:        0.82 cm  LV e' lateral:   8.21 cm/s LVOT diam:     2.20 cm  LV E/e' lateral: 12.9 LV SV:         79 LV SV Index:   40 LVOT Area:     3.80 cm  RIGHT VENTRICLE RV Basal diam:  4.38 cm RV S prime:     18.30 cm/s TAPSE (M-mode): 2.4 cm LEFT ATRIUM             Index       RIGHT ATRIUM           Index LA diam:        4.10 cm 2.08 cm/m  RA Area:     17.70 cm LA Vol (A2C):   82.6 ml 41.81 ml/m RA Volume:   60.30 ml  30.52 ml/m LA Vol (A4C):   38.2 ml 19.34 ml/m LA Biplane Vol: 60.1 ml 30.42 ml/m  AORTIC VALVE AV Area (Vmax):    1.47 cm AV Area (Vmean):   1.37 cm AV Area (VTI):     1.57 cm AV Vmax:           281.00 cm/s AV Vmean:          206.400 cm/s AV VTI:            0.503 m AV Peak Grad:      31.6 mmHg AV Mean Grad:      21.0 mmHg LVOT Vmax:         108.50 cm/s LVOT Vmean:        74.250 cm/s LVOT VTI:          0.208 m LVOT/AV VTI ratio: 0.41  AORTA Ao Root diam: 3.80 cm Ao Asc diam:  4.30 cm MITRAL VALVE                TRICUSPID VALVE MV Area  (PHT): 3.48 cm     TR Peak grad:   37.9 mmHg MV Decel Time: 218 msec     TR Vmax:        308.00 cm/s MR Peak grad: 134.6 mmHg MR Vmax:      580.00 cm/s   SHUNTS MV E velocity: 106.00 cm/s  Systemic VTI:  0.21 m MV A velocity: 145.00 cm/s  Systemic Diam: 2.20 cm MV E/A ratio:  0.73 Harrell Gave End MD Electronically signed by Nelva Bush MD Signature Date/Time: 01/07/2021/8:35:45 AM    Final      Medications:    . (feeding supplement) PROSource Plus  30 mL Oral Daily  . allopurinol  300 mg Oral Daily  . azithromycin  500 mg Oral Daily  . cefdinir  300 mg Oral Q12H  . dextromethorphan-guaiFENesin  1 tablet Oral BID  . feeding supplement  237 mL Oral BID BM  . ferrous sulfate  325 mg Oral Daily  . latanoprost  1 drop Both Eyes QHS  . magnesium gluconate  500 mg Oral Daily  . mouth rinse  15 mL Mouth Rinse BID  . midodrine  2.5 mg Oral TID WC  .  tamsulosin  0.4 mg Oral Daily  . vitamin B-12  1,000 mcg Oral Daily   albuterol, dextromethorphan-guaiFENesin, magnesium hydroxide, ondansetron **OR** ondansetron (ZOFRAN) IV, traZODone  Assessment/ Plan:  Mr. Harry Strong is a 86 y.o.  male who presents to the ED with shortness of breath and productive cough. He states he had a fever for a few days prior. He has an medical history of B-cell lymphoma, BPH, gout and pancytopenia.   1. 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 Ascites vs urinary retention Abdominal US negative for ascites Adequate urine output     LOS: 6 Marine Lezotte 5/19/202212:13 PM

## 2021-01-08 NOTE — Discharge Summary (Signed)
Physician Discharge Summary  Harry Strong FYB:[id number] DOB: Apr 20, 1935 DOA: 01/02/2021  PCP: Mechele Claude, FNP  Admit date: 01/02/2021 Discharge date: 01/08/2021  Admitted From: Home  Disposition: Home with [facility]  Recommendations for Outpatient Follow-up:  1. Follow up with PCP in 1-2 weeks 2. Please obtain BMP/CBC in one week 3. Needs to follow up with Dr Tod Persia for further care of pancytopenia and history of lymphoma.   Home Health: yes, PT, OT, Aid, SW.   Discharge Condition: Stable.  CODE STATUS: Full code Diet recommendation: Regular  Brief/Interim Summary: 86 year old with past medical history significant for B-cell lymphoma, BPH, gout, pancytopenia who presented to the ED with acute onset of worsening shortness of breath associated with cough.  He reports intermittent low-grade fever for a few days.  Was initially found to be hypoxic oxygen saturation 88 on room air, respiration rate 27, leukopenia White blood cell 1.7 chest x-ray bilateral infiltrates. Admitted for pneumonia and hyponatremia.   1-Multifocal pneumonia, acute hypoxic respiratory failure; -Presented with oxygen saturation 87-88 on room air.  Chest x-ray with bilateral infiltrate, pancytopenia -Continue with IV ceftriaxone and azithromycin day 3. -Continue with flutter valve, Mucinex. -COVID-19 negative. -Discharge on Cefdinir for 2 more days. Zithromax one more day to complete 5 days.  -He will be discharge on 2 L oxygen.   2-Acute hypoxic respiratory failure: Secondary to multifocal pneumonia: See #1  3-Sepsis due to pneumonia: POA Presented with tachypnea, leukopenia, pneumonia on chest x-ray Improved.  Plan to discharge on Cefdinir for 2 more days.   4-Anemia with neutropenia and history of B-cell lymphoma: -Continue with isolation, continue B12 supplements. -Hematology was consulted plan to hold Granix because it can interfere with bone marrow biopsy should he have recurrence of his  lymphoma. -Hematology anticipate improvement of pancytopenia with antibiotics// Component of splenomegaly.  -Patient will need to transfer his care to [facility] from [place] -Hematology will arrange follow-up -US splenomegaly. Follow up with oncologist out patient.   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 Improving, appreciate nephrology follow-up Continue with water restriction.   6-Murmur; consistent with aortic stenosis, mitral tricuspid valve regurgitation: He will need follow-up as an outpatient  BPH: Continue with Flomax Gout: Continue with allopurinol Glaucoma: Continue with ophthalmic drops  Nutrition Problem: Increased nutrient needs Etiology: acute illness,chronic illness   Discharge Diagnoses:  Active Problems:   Multifocal pneumonia    Discharge Instructions  Discharge Instructions    Diet - low sodium heart healthy   Complete by: As directed    Diet general   Complete by: As directed    Increase activity slowly   Complete by: As directed    Increase activity slowly   Complete by: As directed      Allergies as of 01/08/2021      Reactions   Aspirin Swelling   Tylenol [acetaminophen] Other (See Comments)   Red face, tongue, and lips      Medication List    TAKE these medications   albuterol 108 (90 Base) MCG/ACT inhaler Commonly known as: VENTOLIN HFA Inhale 2 puffs into the lungs every 6 (six) hours as needed for wheezing or shortness of breath.   allopurinol 300 MG tablet Commonly known as: ZYLOPRIM Take 1 tablet (300 mg total) by mouth daily.   azithromycin 500 MG tablet Commonly known as: ZITHROMAX Take 1 tablet (500 mg total) by mouth daily for 1 day.   cefdinir 300 MG capsule Commonly known as: OMNICEF Take 1 capsule (300 mg total)  by mouth every 12 (twelve) hours.   dextromethorphan-guaiFENesin 30-600 MG 12hr tablet Commonly known as: MUCINEX DM Take 1 tablet by mouth 2 (two) times daily.   ferrous  sulfate 325 (65 FE) MG tablet Take 1 mg by mouth daily.   latanoprost 0.005 % ophthalmic solution Commonly known as: XALATAN Place 1 drop into both eyes at bedtime.   magnesium gluconate 500 MG tablet Commonly known as: MAGONATE Take 1 tablet (500 mg total) by mouth daily.   midodrine 2.5 MG tablet Commonly known as: PROAMATINE Take 1 tablet (2.5 mg total) by mouth 3 (three) times daily with meals.   tamsulosin 0.4 MG Caps capsule Commonly known as: FLOMAX Take 1 capsule (0.4 mg total) by mouth daily.   vitamin B-12 1000 MCG tablet Commonly known as: CYANOCOBALAMIN Take 1,000 mcg by mouth daily.            Durable Medical Equipment  (From admission, onward)         Start     Ordered   01/08/21 1028  For home use only DME oxygen  Once       Question Answer Comment  Length of Need 6 Months   Mode or (Route) Nasal cannula   Liters per Minute 2   Frequency Continuous (stationary and portable oxygen unit needed)   Oxygen delivery system Gas      01/08/21 1027          Follow-up Information    Mechele Claude, FNP. Go on 01/13/2021.   Specialty: Family Medicine Why: Appointment scheduled for 9:30 am arrive at 9am.   Take photo ID and proof of insurance.   Contact information: [place] [address] [phone]        Lloyd Huger, MD. Go on 01/15/2021.   Specialty: Oncology Why: @ 11:15am Contact information: [address] [place] [address] [phone]              Allergies  Allergen Reactions  . Aspirin Swelling  . Tylenol [Acetaminophen] Other (See Comments)    Red face, tongue, and lips    Consultations:  Nephrology   Oncology    Procedures/Studies: CT Chest Wo Contrast  Result Date: 01/02/2021 CLINICAL DATA:  Cough, lung nodules, generalized weakness, shortness of breath EXAM: CT CHEST WITHOUT CONTRAST TECHNIQUE: Multidetector CT imaging of the chest was performed following the standard protocol  without IV contrast. Sagittal and coronal MPR images reconstructed from axial data set. COMPARISON:  Chest radiograph 01/02/2021 FINDINGS: Cardiovascular: Atherosclerotic calcifications aorta, proximal great vessels and coronary arteries. Upper normal caliber ascending thoracic aorta 3.9 cm diameter. Mitral annular and aortic valvular calcifications. No pericardial effusion. RIGHT jugular Port-A-Cath with tip in SVC. Mediastinum/Nodes: Esophagus unremarkable. Base of cervical region normal appearance. Few scattered normal size mediastinal lymph nodes without definite thoracic adenopathy. Lungs/Pleura: Small BILATERAL pleural effusions. Patchy predominantly peribronchial infiltrates throughout both lungs involving all lobes consistent with multifocal pneumonia. No pneumothorax. No discrete pulmonary mass. Upper Abdomen: Spleen appears enlarged, 13.6 x 8.9 cm image 145. Remaining visualized upper abdomen unremarkable. Musculoskeletal: No osseous abnormalities. Tiny subcutaneous nodule presternal 11 mm greatest diameter image 74 question epidermal inclusion cyst versus sebaceous cyst. IMPRESSION: Extensive BILATERAL patchy pulmonary infiltrates, predominantly perivascular, consistent with multifocal pneumonia. Radiographic follow-up until resolution recommended to exclude underlying pulmonary nodules. Small BILATERAL pleural effusions. Mild splenic enlargement. Scattered atherosclerotic calcifications including coronary arteries. Aortic Atherosclerosis (ICD10-I70.0). Electronically Signed   By: Lavonia Dana M.D.   On: 01/02/2021 15:55  US Abdomen Limited  Result Date: 01/07/2021 CLINICAL DATA:  Abdominal distension EXAM: LIMITED ABDOMEN ULTRASOUND FOR ASCITES TECHNIQUE: Limited ultrasound survey for ascites was performed in all four abdominal quadrants. COMPARISON:  None. FINDINGS: No evident ascites.  Peristalsing bowel noted. Spleen noted to be enlarged measuring 16.1 x 17.0 x 6.6 cm with a measured splenic  volume of 945 cubic cm. No splenic lesions evident. No perisplenic fluid or adenopathy. IMPRESSION: No evident ascites.  Splenomegaly. Electronically Signed   By: Lowella Grip III M.D.   On: 01/07/2021 13:28   DG Chest Portable 1 View  Result Date: 01/02/2021 CLINICAL DATA:  Cough and short of breath EXAM: PORTABLE CHEST 1 VIEW COMPARISON:  None. FINDINGS: Right-sided central venous port tip over the distal SVC. Widespread bilateral airspace opacities some of which are nodular in appearance. Possible small right effusion. Borderline cardiomegaly with aortic atherosclerosis. No pneumothorax. IMPRESSION: Widespread bilateral airspace opacities, some of which are nodular in appearance. Findings could be secondary to diffuse bilateral pneumonia, however given nodular appearance, the possibility of pulmonary nodules is also raised. Suggest chest CT for further evaluation. Electronically Signed   By: Donavan Foil M.D.   On: 01/02/2021 15:26   ECHOCARDIOGRAM COMPLETE  Result Date: 01/07/2021    ECHOCARDIOGRAM REPORT   Patient Name:   TYON CERASOLI Date of Exam: 01/06/2021 Medical Rec #:  [id number]        Height:       69.0 in Accession #:    [id number]       Weight:       180.0 lb Date of Birth:  1934-09-27         BSA:          1.976 m Patient Age:    37 years         BP:           118/62 mmHg Patient Gender: M                HR:           80 bpm. Exam Location:  [facility] Procedure: 2D Echo, Cardiac Doppler and Color Doppler Indications:     R01.1 Murmur  History:         Patient has no prior history of Echocardiogram examinations.  Sonographer:     Wilford Sports Rodgers-Jones Referring Phys:  [id number] ZDGLO AMERY Diagnosing Phys: Nelva Bush MD IMPRESSIONS  1. Left ventricular ejection fraction, by estimation, is 60 to 65%. The left ventricle has normal function. The left ventricle has no regional wall motion abnormalities. Left ventricular diastolic parameters are consistent with Grade I diastolic dysfunction  (impaired relaxation).  2. Right ventricular systolic function is normal. The right ventricular size is mildly enlarged. There is moderately elevated pulmonary artery systolic pressure.  3. Right atrial size was mildly dilated.  4. The mitral valve is degenerative. Mild to moderate mitral valve regurgitation. No evidence of mitral stenosis.  5. Tricuspid valve regurgitation is mild to moderate.  6. The aortic valve is tricuspid. There is moderate calcification of the aortic valve. There is severe thickening of the aortic valve. Aortic valve regurgitation is not visualized. Moderate aortic valve stenosis. Aortic valve mean gradient measures 21.0  mmHg.  7. Aortic dilatation noted. There is borderline dilatation of the aortic root, measuring 38 mm. There is mild dilatation of the ascending aorta, measuring 43 mm.  8. The inferior vena cava is normal in size with <50% respiratory variability, suggesting right atrial pressure of 8  mmHg. FINDINGS  Left Ventricle: Left ventricular ejection fraction, by estimation, is 60 to 65%. The left ventricle has normal function. The left ventricle has no regional wall motion abnormalities. The left ventricular internal cavity size was normal in size. There is  no left ventricular hypertrophy. Left ventricular diastolic parameters are consistent with Grade I diastolic dysfunction (impaired relaxation). Right Ventricle: The right ventricular size is mildly enlarged. No increase in right ventricular wall thickness. Right ventricular systolic function is normal. There is moderately elevated pulmonary artery systolic pressure. The tricuspid regurgitant velocity is 3.08 m/s, and with an assumed right atrial pressure of 8 mmHg, the estimated right ventricular systolic pressure is 10.1 mmHg. Left Atrium: Left atrial size was normal in size. Right Atrium: Right atrial size was mildly dilated. Pericardium: There is no evidence of pericardial effusion. Mitral Valve: The mitral valve is  degenerative in appearance. There is mild thickening of the mitral valve leaflet(s). Mild to moderate mitral annular calcification. Mild to moderate mitral valve regurgitation. No evidence of mitral valve stenosis. Tricuspid Valve: The tricuspid valve is normal in structure. Tricuspid valve regurgitation is mild to moderate. Aortic Valve: The aortic valve is tricuspid. There is moderate calcification of the aortic valve. There is severe thickening of the aortic valve. Aortic valve regurgitation is not visualized. Moderate aortic stenosis is present. Aortic valve mean gradient measures 21.0 mmHg. Aortic valve peak gradient measures 31.6 mmHg. Aortic valve area, by VTI measures 1.57 cm. Pulmonic Valve: The pulmonic valve was not well visualized. Pulmonic valve regurgitation is not visualized. No evidence of pulmonic stenosis. Aorta: Aortic dilatation noted. There is borderline dilatation of the aortic root, measuring 38 mm. There is mild dilatation of the ascending aorta, measuring 43 mm. Pulmonary Artery: The pulmonary artery is not well seen. Venous: The inferior vena cava is normal in size with less than 50% respiratory variability, suggesting right atrial pressure of 8 mmHg. IAS/Shunts: The interatrial septum was not well visualized.  LEFT VENTRICLE PLAX 2D LVIDd:         4.48 cm  Diastology LVIDs:         2.14 cm  LV e' medial:    8.45 cm/s LV PW:         0.97 cm  LV E/e' medial:  12.5 LV IVS:        0.82 cm  LV e' lateral:   8.21 cm/s LVOT diam:     2.20 cm  LV E/e' lateral: 12.9 LV SV:         79 LV SV Index:   40 LVOT Area:     3.80 cm  RIGHT VENTRICLE RV Basal diam:  4.38 cm RV S prime:     18.30 cm/s TAPSE (M-mode): 2.4 cm LEFT ATRIUM             Index       RIGHT ATRIUM           Index LA diam:        4.10 cm 2.08 cm/m  RA Area:     17.70 cm LA Vol (A2C):   82.6 ml 41.81 ml/m RA Volume:   60.30 ml  30.52 ml/m LA Vol (A4C):   38.2 ml 19.34 ml/m LA Biplane Vol: 60.1 ml 30.42 ml/m  AORTIC VALVE AV Area  (Vmax):    1.47 cm AV Area (Vmean):   1.37 cm AV Area (VTI):     1.57 cm AV Vmax:  281.00 cm/s AV Vmean:          206.400 cm/s AV VTI:            0.503 m AV Peak Grad:      31.6 mmHg AV Mean Grad:      21.0 mmHg LVOT Vmax:         108.50 cm/s LVOT Vmean:        74.250 cm/s LVOT VTI:          0.208 m LVOT/AV VTI ratio: 0.41  AORTA Ao Root diam: 3.80 cm Ao Asc diam:  4.30 cm MITRAL VALVE                TRICUSPID VALVE MV Area (PHT): 3.48 cm     TR Peak grad:   37.9 mmHg MV Decel Time: 218 msec     TR Vmax:        308.00 cm/s MR Peak grad: 134.6 mmHg MR Vmax:      580.00 cm/s   SHUNTS MV E velocity: 106.00 cm/s  Systemic VTI:  0.21 m MV A velocity: 145.00 cm/s  Systemic Diam: 2.20 cm MV E/A ratio:  0.73 Harrell Gave End MD Electronically signed by Nelva Bush MD Signature Date/Time: 01/07/2021/8:35:45 AM    Final      Subjective: He is feeling better, breathing better. Less cramps.   Discharge Exam: Vitals:   01/08/21 0750 01/08/21 1138  BP: (!) 106/57 96/69  Pulse: 68 78  Resp: 18 18  Temp: 98.5 F (36.9 C) 99.4 F (37.4 C)  SpO2: 99% 97%     General: Pt is alert, awake, not in acute distress Cardiovascular: RRR, S1/S2 +, no rubs, no gallops Respiratory: CTA bilaterally, no wheezing, no rhonchi Abdominal: Soft, NT, ND, bowel sounds + Extremities: no edema, no cyanosis    The results of significant diagnostics from this hospitalization (including imaging, microbiology, ancillary and laboratory) are listed below for reference.     Microbiology: Recent Results (from the past 240 hour(s))  Resp Panel by RT-PCR (Flu A&B, Covid) Nasopharyngeal Swab     Status: None   Collection Time: 01/02/21  3:08 PM   Specimen: Nasopharyngeal Swab; Nasopharyngeal(NP) swabs in vial transport medium  Result Value Ref Range Status   SARS Coronavirus 2 by RT PCR NEGATIVE NEGATIVE Final    Comment: (NOTE) SARS-CoV-2 target nucleic acids are NOT DETECTED.  The SARS-CoV-2 RNA is generally  detectable in upper respiratory specimens during the acute phase of infection. The lowest concentration of SARS-CoV-2 viral copies this assay can detect is 138 copies/mL. A negative result does not preclude SARS-Cov-2 infection and should not be used as the sole basis for treatment or other patient management decisions. A negative result may occur with  improper specimen collection/handling, submission of specimen other than nasopharyngeal swab, presence of viral mutation(s) within the areas targeted by this assay, and inadequate number of viral copies(<138 copies/mL). A negative result must be combined with clinical observations, patient history, and epidemiological information. The expected result is Negative.  Fact Sheet for Patients:  EntrepreneurPulse.com.au  Fact Sheet for Healthcare Providers:  IncredibleEmployment.be  This test is no t yet approved or cleared by the [place] FDA and  has been authorized for detection and/or diagnosis of SARS-CoV-2 by FDA under an Emergency Use Authorization (EUA). This EUA will remain  in effect (meaning this test can be used) for the duration of the COVID-19 declaration under Section 564(b)(1) of the Act, 21 U.S.C.section 360bbb-3(b)(1), unless the authorization is  terminated  or revoked sooner.       Influenza A by PCR NEGATIVE NEGATIVE Final   Influenza B by PCR NEGATIVE NEGATIVE Final    Comment: (NOTE) The Xpert Xpress SARS-CoV-2/FLU/RSV plus assay is intended as an aid in the diagnosis of influenza from Nasopharyngeal swab specimens and should not be used as a sole basis for treatment. Nasal washings and aspirates are unacceptable for Xpert Xpress SARS-CoV-2/FLU/RSV testing.  Fact Sheet for Patients: EntrepreneurPulse.com.au  Fact Sheet for Healthcare Providers: IncredibleEmployment.be  This test is not yet approved or cleared by the [place] FDA  and has been authorized for detection and/or diagnosis of SARS-CoV-2 by FDA under an Emergency Use Authorization (EUA). This EUA will remain in effect (meaning this test can be used) for the duration of the COVID-19 declaration under Section 564(b)(1) of the Act, 21 U.S.C. section 360bbb-3(b)(1), unless the authorization is terminated or revoked.  Performed at [facility], [address] [place], [place] [address]      Labs: BNP (last 3 results) Recent Labs    01/02/21 1415  BNP 17.9   Basic Metabolic Panel: Recent Labs  Lab 01/03/21 0153 01/04/21 0458 01/05/21 0834 01/06/21 0449 01/07/21 0412 01/08/21 0855  NA 127* 131* 129* 128* 130* 130*  K 3.8 3.9 3.8  --  5.0 4.4  CL 98 101 98  --  96* 96*  CO2 _0 --  25 24  GLUCOSE 104* 116* 216*  --  142* 142*  BUN _1 --  21 21  CREATININE 1.08 1.06 1.07  --  1.03 0.97  CALCIUM 8.1* 7.9* 8.2*  --  8.5* 8.6*  MG  --   --   --   --  2.0  --    Liver Function Tests: Recent Labs  Lab 01/02/21 1404  AST 24  ALT 10  ALKPHOS 62  BILITOT 0.7  PROT 5.8*  ALBUMIN 3.0*   No results for input(s): LIPASE, AMYLASE in the last 168 hours. No results for input(s): AMMONIA in the last 168 hours. CBC: Recent Labs  Lab 01/02/21 1404 01/03/21 0153 01/04/21 0458 01/05/21 0834 01/06/21 0449  WBC 1.7* 1.3* 1.0* 1.5* 1.6*  NEUTROABS 1.4*  --   --   --   --   HGB 9.9* 9.4* 8.8* 9.8* 9.3*  HCT 28.6* 27.4* 26.3* 28.5* 27.2*  MCV 79.4* 80.4 80.9 81.2 81.4  PLT 118* 94* 86* 121* 117*   Cardiac Enzymes: No results for input(s): CKTOTAL, CKMB, CKMBINDEX, TROPONINI in the last 168 hours. BNP: Invalid input(s): POCBNP CBG: No results for input(s): GLUCAP in the last 168 hours. D-Dimer No results for input(s): DDIMER in the last 72 hours. Hgb A1c No results for input(s): HGBA1C in the last 72 hours. Lipid Profile No results for input(s): CHOL, HDL, LDLCALC, TRIG, CHOLHDL, LDLDIRECT in the last 72  hours. Thyroid function studies Recent Labs    01/07/21 0412  TSH 2.414   Anemia work up Recent Labs    01/06/21 0449  VITAMINB12 463   Urinalysis    Component Value Date/Time   COLORURINE YELLOW (A) 01/02/2021 1406   APPEARANCEUR CLEAR (A) 01/02/2021 1406   LABSPEC 1.009 01/02/2021 1406   PHURINE 5.0 01/02/2021 1406   GLUCOSEU NEGATIVE 01/02/2021 1406   [place] (A) 01/02/2021 1406   [place] 01/02/2021 1406   [place] 01/02/2021 1406   PROTEINUR 30 (A) 01/02/2021 1406   NITRITE NEGATIVE 01/02/2021 1406   LEUKOCYTESUR  NEGATIVE 01/02/2021 1406   Sepsis Labs Invalid input(s): PROCALCITONIN,  WBC,  LACTICIDVEN Microbiology Recent Results (from the past 240 hour(s))  Resp Panel by RT-PCR (Flu A&B, Covid) Nasopharyngeal Swab     Status: None   Collection Time: 01/02/21  3:08 PM   Specimen: Nasopharyngeal Swab; Nasopharyngeal(NP) swabs in vial transport medium  Result Value Ref Range Status   SARS Coronavirus 2 by RT PCR NEGATIVE NEGATIVE Final    Comment: (NOTE) SARS-CoV-2 target nucleic acids are NOT DETECTED.  The SARS-CoV-2 RNA is generally detectable in upper respiratory specimens during the acute phase of infection. The lowest concentration of SARS-CoV-2 viral copies this assay can detect is 138 copies/mL. A negative result does not preclude SARS-Cov-2 infection and should not be used as the sole basis for treatment or other patient management decisions. A negative result may occur with  improper specimen collection/handling, submission of specimen other than nasopharyngeal swab, presence of viral mutation(s) within the areas targeted by this assay, and inadequate number of viral copies(<138 copies/mL). A negative result must be combined with clinical observations, patient history, and epidemiological information. The expected result is Negative.  Fact Sheet for Patients:  EntrepreneurPulse.com.au  Fact Sheet for  Healthcare Providers:  IncredibleEmployment.be  This test is no t yet approved or cleared by the [place] FDA and  has been authorized for detection and/or diagnosis of SARS-CoV-2 by FDA under an Emergency Use Authorization (EUA). This EUA will remain  in effect (meaning this test can be used) for the duration of the COVID-19 declaration under Section 564(b)(1) of the Act, 21 U.S.C.section 360bbb-3(b)(1), unless the authorization is terminated  or revoked sooner.       Influenza A by PCR NEGATIVE NEGATIVE Final   Influenza B by PCR NEGATIVE NEGATIVE Final    Comment: (NOTE) The Xpert Xpress SARS-CoV-2/FLU/RSV plus assay is intended as an aid in the diagnosis of influenza from Nasopharyngeal swab specimens and should not be used as a sole basis for treatment. Nasal washings and aspirates are unacceptable for Xpert Xpress SARS-CoV-2/FLU/RSV testing.  Fact Sheet for Patients: EntrepreneurPulse.com.au  Fact Sheet for Healthcare Providers: IncredibleEmployment.be  This test is not yet approved or cleared by the [place] FDA and has been authorized for detection and/or diagnosis of SARS-CoV-2 by FDA under an Emergency Use Authorization (EUA). This EUA will remain in effect (meaning this test can be used) for the duration of the COVID-19 declaration under Section 564(b)(1) of the Act, 21 U.S.C. section 360bbb-3(b)(1), unless the authorization is terminated or revoked.  Performed at [facility], [address] [place],  [address]      Time coordinating discharge: 40 minutes  SIGNED:   Elmarie Shiley, MD  [facility]

## 2021-01-08 NOTE — TOC Transition Note (Signed)
Transition of Care [facility]) - CM/SW Discharge Note   Patient Details  Name: Harry Strong MRN: [id number] Date of Birth: 1935-06-30  Transition of Care [facility]) CM/SW Contact:  Shelbie Hutching, RN Phone Number: 01/08/2021, 10:52 AM   Clinical Narrative:     Patient is medically cleared for discharge home with home health services through [facility].  Corene Cornea with [facility] aware of discharge for today.  Patient needs oxygen for home.  [facility] given referral for oxygen and it will be delivered to the room.  Patient needs a ride home.  RNCM will arrange [facility] transport once oxygen has been delivered and patient's discharge has been completed.    Final next level of care: [place] Barriers to Discharge: Barriers Resolved   Patient Goals and CMS Choice Patient states their goals for this hospitalization and ongoing recovery are:: agrees to home health services CMS Medicare.gov Compare Post Acute Care list provided to:: Patient Choice offered to / list presented to : Patient  Discharge Placement                       Discharge Plan and Services   Discharge Planning Services: CM Consult Post Acute Care Choice: Home Health          DME Arranged: Oxygen DME Agency: [facility] Date DME Agency Contacted: 01/08/21 Time DME Agency Contacted: [id number] Representative spoke with at DME Agency: [place]: RN,PT,OT,Social Work [place]: [place] ([place]) Date [place]: 01/08/21 Time [place]: 1051 Representative spoke with at [place]: [place] ([place]) Interventions     Readmission Risk Interventions No flowsheet data found.

## 2021-01-08 NOTE — Progress Notes (Signed)
SATURATION QUALIFICATIONS: (This note is used to comply with regulatory documentation for home oxygen)  Patient Saturations on Room Air at Rest = 88%  Patient Saturations on Room Air while Ambulating = 86%  Patient Saturations on 2 Liters of oxygen while Ambulating = 97%  Please briefly explain why patient needs home oxygen:

## 2021-01-08 NOTE — Discharge Instructions (Signed)
Limit Fluid /water to 1.2 L in 24 hours.

## 2021-01-09 LAB — PROTEIN ELECTROPHORESIS, SERUM
A/G Ratio: 1.1 (ref 0.7–1.7)
Albumin ELP: 3 g/dL (ref 2.9–4.4)
Alpha-1-Globulin: 0.4 g/dL (ref 0.0–0.4)
Alpha-2-Globulin: 0.9 g/dL (ref 0.4–1.0)
Beta Globulin: 0.8 g/dL (ref 0.7–1.3)
Gamma Globulin: 0.7 g/dL (ref 0.4–1.8)
Globulin, Total: 2.7 g/dL (ref 2.2–3.9)
Total Protein ELP: 5.7 g/dL — ABNORMAL LOW (ref 6.0–8.5)

## 2021-01-15 ENCOUNTER — Inpatient Hospital Stay: Payer: Medicare Other

## 2021-01-15 ENCOUNTER — Inpatient Hospital Stay: Payer: Medicare Other | Admitting: Oncology

## 2021-01-15 DIAGNOSIS — D61818 Other pancytopenia: Secondary | ICD-10-CM

## 2021-02-01 ENCOUNTER — Other Ambulatory Visit: Payer: Self-pay

## 2021-02-01 ENCOUNTER — Encounter: Payer: Self-pay | Admitting: Internal Medicine

## 2021-02-01 ENCOUNTER — Inpatient Hospital Stay
Admission: EM | Admit: 2021-02-01 | Discharge: 2021-02-20 | DRG: 207 | Disposition: E | Payer: Medicare Other | Attending: Pulmonary Disease | Admitting: Pulmonary Disease

## 2021-02-01 ENCOUNTER — Inpatient Hospital Stay: Payer: Medicare Other

## 2021-02-01 ENCOUNTER — Emergency Department: Payer: Medicare Other

## 2021-02-01 DIAGNOSIS — J9601 Acute respiratory failure with hypoxia: Secondary | ICD-10-CM | POA: Diagnosis not present

## 2021-02-01 DIAGNOSIS — J156 Pneumonia due to other aerobic Gram-negative bacteria: Principal | ICD-10-CM | POA: Diagnosis present

## 2021-02-01 DIAGNOSIS — R571 Hypovolemic shock: Secondary | ICD-10-CM | POA: Diagnosis present

## 2021-02-01 DIAGNOSIS — Z01818 Encounter for other preprocedural examination: Secondary | ICD-10-CM

## 2021-02-01 DIAGNOSIS — Z9981 Dependence on supplemental oxygen: Secondary | ICD-10-CM

## 2021-02-01 DIAGNOSIS — Z515 Encounter for palliative care: Secondary | ICD-10-CM

## 2021-02-01 DIAGNOSIS — R918 Other nonspecific abnormal finding of lung field: Secondary | ICD-10-CM | POA: Diagnosis not present

## 2021-02-01 DIAGNOSIS — C9110 Chronic lymphocytic leukemia of B-cell type not having achieved remission: Secondary | ICD-10-CM | POA: Diagnosis present

## 2021-02-01 DIAGNOSIS — D689 Coagulation defect, unspecified: Secondary | ICD-10-CM | POA: Diagnosis present

## 2021-02-01 DIAGNOSIS — R578 Other shock: Secondary | ICD-10-CM | POA: Diagnosis present

## 2021-02-01 DIAGNOSIS — Z0189 Encounter for other specified special examinations: Secondary | ICD-10-CM

## 2021-02-01 DIAGNOSIS — D849 Immunodeficiency, unspecified: Secondary | ICD-10-CM | POA: Diagnosis present

## 2021-02-01 DIAGNOSIS — I4891 Unspecified atrial fibrillation: Secondary | ICD-10-CM | POA: Diagnosis not present

## 2021-02-01 DIAGNOSIS — R54 Age-related physical debility: Secondary | ICD-10-CM | POA: Diagnosis present

## 2021-02-01 DIAGNOSIS — I4949 Other premature depolarization: Secondary | ICD-10-CM | POA: Diagnosis present

## 2021-02-01 DIAGNOSIS — Z6829 Body mass index (BMI) 29.0-29.9, adult: Secondary | ICD-10-CM

## 2021-02-01 DIAGNOSIS — A419 Sepsis, unspecified organism: Secondary | ICD-10-CM

## 2021-02-01 DIAGNOSIS — I452 Bifascicular block: Secondary | ICD-10-CM | POA: Diagnosis present

## 2021-02-01 DIAGNOSIS — Z7189 Other specified counseling: Secondary | ICD-10-CM | POA: Diagnosis not present

## 2021-02-01 DIAGNOSIS — E876 Hypokalemia: Secondary | ICD-10-CM

## 2021-02-01 DIAGNOSIS — E861 Hypovolemia: Secondary | ICD-10-CM | POA: Diagnosis present

## 2021-02-01 DIAGNOSIS — E440 Moderate protein-calorie malnutrition: Secondary | ICD-10-CM | POA: Insufficient documentation

## 2021-02-01 DIAGNOSIS — I9589 Other hypotension: Secondary | ICD-10-CM | POA: Diagnosis present

## 2021-02-01 DIAGNOSIS — L982 Febrile neutrophilic dermatosis [Sweet]: Secondary | ICD-10-CM | POA: Diagnosis present

## 2021-02-01 DIAGNOSIS — R600 Localized edema: Secondary | ICD-10-CM

## 2021-02-01 DIAGNOSIS — N179 Acute kidney failure, unspecified: Secondary | ICD-10-CM | POA: Diagnosis present

## 2021-02-01 DIAGNOSIS — R000 Tachycardia, unspecified: Secondary | ICD-10-CM | POA: Diagnosis present

## 2021-02-01 DIAGNOSIS — N400 Enlarged prostate without lower urinary tract symptoms: Secondary | ICD-10-CM | POA: Diagnosis present

## 2021-02-01 DIAGNOSIS — Z87440 Personal history of urinary (tract) infections: Secondary | ICD-10-CM

## 2021-02-01 DIAGNOSIS — R6520 Severe sepsis without septic shock: Secondary | ICD-10-CM | POA: Diagnosis not present

## 2021-02-01 DIAGNOSIS — Z4659 Encounter for fitting and adjustment of other gastrointestinal appliance and device: Secondary | ICD-10-CM

## 2021-02-01 DIAGNOSIS — M7989 Other specified soft tissue disorders: Secondary | ICD-10-CM | POA: Diagnosis present

## 2021-02-01 DIAGNOSIS — B952 Enterococcus as the cause of diseases classified elsewhere: Secondary | ICD-10-CM | POA: Diagnosis present

## 2021-02-01 DIAGNOSIS — Z20822 Contact with and (suspected) exposure to covid-19: Secondary | ICD-10-CM | POA: Diagnosis present

## 2021-02-01 DIAGNOSIS — J189 Pneumonia, unspecified organism: Secondary | ICD-10-CM

## 2021-02-01 DIAGNOSIS — E872 Acidosis, unspecified: Secondary | ICD-10-CM

## 2021-02-01 DIAGNOSIS — Z79899 Other long term (current) drug therapy: Secondary | ICD-10-CM

## 2021-02-01 DIAGNOSIS — J9621 Acute and chronic respiratory failure with hypoxia: Secondary | ICD-10-CM | POA: Diagnosis present

## 2021-02-01 DIAGNOSIS — D591 Autoimmune hemolytic anemia, unspecified: Secondary | ICD-10-CM | POA: Diagnosis present

## 2021-02-01 DIAGNOSIS — Z886 Allergy status to analgesic agent status: Secondary | ICD-10-CM

## 2021-02-01 DIAGNOSIS — Z66 Do not resuscitate: Secondary | ICD-10-CM | POA: Diagnosis not present

## 2021-02-01 DIAGNOSIS — I350 Nonrheumatic aortic (valve) stenosis: Secondary | ICD-10-CM | POA: Diagnosis present

## 2021-02-01 DIAGNOSIS — Z95828 Presence of other vascular implants and grafts: Secondary | ICD-10-CM | POA: Diagnosis not present

## 2021-02-01 DIAGNOSIS — E222 Syndrome of inappropriate secretion of antidiuretic hormone: Secondary | ICD-10-CM | POA: Diagnosis present

## 2021-02-01 DIAGNOSIS — J1529 Pneumonia due to other staphylococcus: Secondary | ICD-10-CM | POA: Diagnosis present

## 2021-02-01 DIAGNOSIS — I10 Essential (primary) hypertension: Secondary | ICD-10-CM | POA: Diagnosis present

## 2021-02-01 DIAGNOSIS — D61818 Other pancytopenia: Secondary | ICD-10-CM | POA: Diagnosis present

## 2021-02-01 DIAGNOSIS — C8470 Anaplastic large cell lymphoma, ALK-negative, unspecified site: Secondary | ICD-10-CM

## 2021-02-01 DIAGNOSIS — E871 Hypo-osmolality and hyponatremia: Secondary | ICD-10-CM

## 2021-02-01 DIAGNOSIS — R579 Shock, unspecified: Secondary | ICD-10-CM

## 2021-02-01 DIAGNOSIS — J9690 Respiratory failure, unspecified, unspecified whether with hypoxia or hypercapnia: Secondary | ICD-10-CM

## 2021-02-01 DIAGNOSIS — R6521 Severe sepsis with septic shock: Secondary | ICD-10-CM

## 2021-02-01 DIAGNOSIS — M109 Gout, unspecified: Secondary | ICD-10-CM | POA: Diagnosis present

## 2021-02-01 HISTORY — DX: Nonrheumatic aortic (valve) stenosis: I35.0

## 2021-02-01 HISTORY — DX: Pulmonary hypertension, unspecified: I27.20

## 2021-02-01 HISTORY — DX: Other pancytopenia: D61.818

## 2021-02-01 LAB — APTT: aPTT: 32 seconds (ref 24–36)

## 2021-02-01 LAB — BLOOD GAS, ARTERIAL
Acid-base deficit: 3.7 mmol/L — ABNORMAL HIGH (ref 0.0–2.0)
Acid-base deficit: 6 mmol/L — ABNORMAL HIGH (ref 0.0–2.0)
Allens test (pass/fail): POSITIVE — AB
Bicarbonate: 19.7 mmol/L — ABNORMAL LOW (ref 20.0–28.0)
Bicarbonate: 19.6 mmol/L — ABNORMAL LOW (ref 20.0–28.0)
Delivery systems: POSITIVE
Expiratory PAP: 5
FIO2: 0.5
FIO2: 0.65
Inspiratory PAP: 10
MECHVT: 480 mL
O2 Saturation: 81.7 %
O2 Saturation: 94.3 %
PEEP: 5 cmH2O
Patient temperature: 37
Patient temperature: 37
RATE: 8 resp/min
RATE: 24 resp/min
pCO2 arterial: 29 mmHg — ABNORMAL LOW (ref 32.0–48.0)
pCO2 arterial: 38 mmHg (ref 32.0–48.0)
pH, Arterial: 7.44 (ref 7.350–7.450)
pH, Arterial: 7.32 — ABNORMAL LOW (ref 7.350–7.450)
pO2, Arterial: 44 mmHg — ABNORMAL LOW (ref 83.0–108.0)
pO2, Arterial: 78 mmHg — ABNORMAL LOW (ref 83.0–108.0)

## 2021-02-01 LAB — RENAL FUNCTION PANEL
Albumin: 2.3 g/dL — ABNORMAL LOW (ref 3.5–5.0)
Anion gap: 7 (ref 5–15)
BUN: 22 mg/dL (ref 8–23)
CO2: 22 mmol/L (ref 22–32)
Calcium: 7.4 mg/dL — ABNORMAL LOW (ref 8.9–10.3)
Chloride: 96 mmol/L — ABNORMAL LOW (ref 98–111)
Creatinine, Ser: 1.21 mg/dL (ref 0.61–1.24)
GFR, Estimated: 59 mL/min — ABNORMAL LOW (ref >60)
Glucose, Bld: 183 mg/dL — ABNORMAL HIGH (ref 70–99)
Phosphorus: 3.3 mg/dL (ref 2.5–4.6)
Potassium: 3.5 mmol/L (ref 3.5–5.1)
Sodium: 125 mmol/L — ABNORMAL LOW (ref 135–145)

## 2021-02-01 LAB — CBC WITH DIFFERENTIAL/PLATELET
Abs Immature Granulocytes: 0.04 10*3/uL (ref 0.00–0.07)
Basophils Absolute: 0 10*3/uL (ref 0.0–0.1)
Basophils Relative: 0 %
Eosinophils Absolute: 0 10*3/uL (ref 0.0–0.5)
Eosinophils Relative: 0 %
HCT: 28.6 % — ABNORMAL LOW (ref 39.0–52.0)
Hemoglobin: 9.9 g/dL — ABNORMAL LOW (ref 13.0–17.0)
Immature Granulocytes: 3 %
Lymphocytes Relative: 1 %
Lymphs Abs: 0 10*3/uL — ABNORMAL LOW (ref 0.7–4.0)
MCH: 26.5 pg (ref 26.0–34.0)
MCHC: 34.6 g/dL (ref 30.0–36.0)
MCV: 76.7 fL — ABNORMAL LOW (ref 80.0–100.0)
Monocytes Absolute: 0.1 10*3/uL (ref 0.1–1.0)
Monocytes Relative: 8 %
Neutro Abs: 1.4 10*3/uL — ABNORMAL LOW (ref 1.7–7.7)
Neutrophils Relative %: 88 %
Platelets: 67 10*3/uL — ABNORMAL LOW (ref 150–400)
RBC: 3.73 MIL/uL — ABNORMAL LOW (ref 4.22–5.81)
RDW: 16.8 % — ABNORMAL HIGH (ref 11.5–15.5)
WBC: 1.6 10*3/uL — ABNORMAL LOW (ref 4.0–10.5)
nRBC: 0 % (ref 0.0–0.2)

## 2021-02-01 LAB — URINALYSIS, COMPLETE (UACMP) WITH MICROSCOPIC
Bacteria, UA: NONE SEEN
Bilirubin Urine: NEGATIVE
Glucose, UA: 50 mg/dL — AB
Ketones, ur: NEGATIVE mg/dL
Leukocytes,Ua: NEGATIVE
Nitrite: NEGATIVE
Protein, ur: 100 mg/dL — AB
Specific Gravity, Urine: 1.02 (ref 1.005–1.030)
Squamous Epithelial / HPF: NONE SEEN (ref 0–5)
pH: 5 (ref 5.0–8.0)

## 2021-02-01 LAB — COMPREHENSIVE METABOLIC PANEL
ALT: 21 U/L (ref 0–44)
AST: 48 U/L — ABNORMAL HIGH (ref 15–41)
Albumin: 3.2 g/dL — ABNORMAL LOW (ref 3.5–5.0)
Alkaline Phosphatase: 70 U/L (ref 38–126)
Anion gap: 7 (ref 5–15)
BUN: 24 mg/dL — ABNORMAL HIGH (ref 8–23)
CO2: 23 mmol/L (ref 22–32)
Calcium: 8.2 mg/dL — ABNORMAL LOW (ref 8.9–10.3)
Chloride: 93 mmol/L — ABNORMAL LOW (ref 98–111)
Creatinine, Ser: 1.27 mg/dL — ABNORMAL HIGH (ref 0.61–1.24)
GFR, Estimated: 56 mL/min — ABNORMAL LOW (ref >60)
Glucose, Bld: 183 mg/dL — ABNORMAL HIGH (ref 70–99)
Potassium: 3.6 mmol/L (ref 3.5–5.1)
Sodium: 123 mmol/L — ABNORMAL LOW (ref 135–145)
Total Bilirubin: 1 mg/dL (ref 0.3–1.2)
Total Protein: 5.2 g/dL — ABNORMAL LOW (ref 6.5–8.1)

## 2021-02-01 LAB — PROTIME-INR
INR: 1.3 — ABNORMAL HIGH (ref 0.8–1.2)
Prothrombin Time: 15.8 seconds — ABNORMAL HIGH (ref 11.4–15.2)

## 2021-02-01 LAB — LACTIC ACID, PLASMA
Lactic Acid, Venous: 3 mmol/L (ref 0.5–1.9)
Lactic Acid, Venous: 1.5 mmol/L (ref 0.5–1.9)
Lactic Acid, Venous: 2 mmol/L (ref 0.5–1.9)
Lactic Acid, Venous: 1.5 mmol/L (ref 0.5–1.9)

## 2021-02-01 LAB — BRAIN NATRIURETIC PEPTIDE: B Natriuretic Peptide: 92.4 pg/mL (ref 0.0–100.0)

## 2021-02-01 LAB — RESP PANEL BY RT-PCR (FLU A&B, COVID) ARPGX2
Influenza A by PCR: NEGATIVE
Influenza B by PCR: NEGATIVE
SARS Coronavirus 2 by RT PCR: NEGATIVE

## 2021-02-01 LAB — PROCALCITONIN: Procalcitonin: 0.45 ng/mL

## 2021-02-01 LAB — TSH: TSH: 1.118 u[IU]/mL (ref 0.350–4.500)

## 2021-02-01 LAB — OSMOLALITY, URINE: Osmolality, Ur: 339 mOsm/kg (ref 300–900)

## 2021-02-01 LAB — CORTISOL-AM, BLOOD: Cortisol - AM: 23 ug/dL — ABNORMAL HIGH (ref 6.7–22.6)

## 2021-02-01 LAB — LACTATE DEHYDROGENASE: LDH: 291 U/L — ABNORMAL HIGH (ref 98–192)

## 2021-02-01 LAB — GLUCOSE, CAPILLARY: Glucose-Capillary: 139 mg/dL — ABNORMAL HIGH (ref 70–99)

## 2021-02-01 LAB — MRSA PCR SCREENING: MRSA by PCR: NEGATIVE

## 2021-02-01 LAB — MAGNESIUM: Magnesium: 1.5 mg/dL — ABNORMAL LOW (ref 1.7–2.4)

## 2021-02-01 LAB — SODIUM, URINE, RANDOM: Sodium, Ur: <10 mmol/L

## 2021-02-01 MED ORDER — LACTATED RINGERS IV SOLN: INTRAVENOUS | Status: DC

## 2021-02-01 MED ORDER — VANCOMYCIN HCL IN DEXTROSE 1-5 GM/200ML-% IV SOLN: 1000.0000 mg | Freq: Once | INTRAVENOUS | Status: DC

## 2021-02-01 MED ORDER — IPRATROPIUM-ALBUTEROL 0.5-2.5 (3) MG/3ML IN SOLN
3.0000 mL | Freq: Once | RESPIRATORY_TRACT | Status: AC
  Administered 2021-02-01: 3 mL via RESPIRATORY_TRACT
  Filled 2021-02-01: qty 9

## 2021-02-01 MED ORDER — NOREPINEPHRINE 4 MG/250ML-% IV SOLN
0.0000 ug/min | INTRAVENOUS | Status: DC
  Administered 2021-02-01: 2 ug/min via INTRAVENOUS
  Administered 2021-02-02: 7 ug/min via INTRAVENOUS
  Administered 2021-02-02: 6 ug/min via INTRAVENOUS
  Administered 2021-02-02: 7.013 ug/min via INTRAVENOUS
  Administered 2021-02-03: 7 ug/min via INTRAVENOUS
  Administered 2021-02-03: 2 ug/min via INTRAVENOUS
  Administered 2021-02-04: 5 ug/min via INTRAVENOUS
  Administered 2021-02-05: 4 ug/min via INTRAVENOUS
  Administered 2021-02-06: 2 ug/min via INTRAVENOUS
  Filled 2021-02-01 (×9): qty 250

## 2021-02-01 MED ORDER — IPRATROPIUM-ALBUTEROL 0.5-2.5 (3) MG/3ML IN SOLN
3.0000 mL | Freq: Once | RESPIRATORY_TRACT | Status: AC
  Administered 2021-02-01: 3 mL via RESPIRATORY_TRACT

## 2021-02-01 MED ORDER — SODIUM CHLORIDE 0.9% FLUSH: 10.0000 mL | INTRAVENOUS | Status: DC | PRN

## 2021-02-01 MED ORDER — FENTANYL CITRATE (PF) 100 MCG/2ML IJ SOLN
INTRAMUSCULAR | Status: AC
  Administered 2021-02-01: 50 ug
  Filled 2021-02-01: qty 2

## 2021-02-01 MED ORDER — DOCUSATE SODIUM 50 MG/5ML PO LIQD
100.0000 mg | Freq: Two times a day (BID) | ORAL | Status: DC
  Administered 2021-02-01 – 2021-02-06 (×9): 100 mg
  Filled 2021-02-01 (×9): qty 10

## 2021-02-01 MED ORDER — PROPOFOL 1000 MG/100ML IV EMUL
5.0000 ug/kg/min | INTRAVENOUS | Status: DC
  Administered 2021-02-01: 43.066 ug/kg/min via INTRAVENOUS
  Filled 2021-02-01: qty 100

## 2021-02-01 MED ORDER — MIDODRINE HCL 5 MG PO TABS
2.5000 mg | ORAL_TABLET | Freq: Three times a day (TID) | ORAL | Status: DC
  Filled 2021-02-01 (×2): qty 0.5

## 2021-02-01 MED ORDER — SODIUM CHLORIDE 0.9 % IV BOLUS
500.0000 mL | Freq: Once | INTRAVENOUS | Status: AC
  Administered 2021-02-01: 500 mL via INTRAVENOUS

## 2021-02-01 MED ORDER — ROCURONIUM BROMIDE 50 MG/5ML IV SOLN
INTRAVENOUS | Status: AC
  Filled 2021-02-01: qty 1

## 2021-02-01 MED ORDER — CHLORHEXIDINE GLUCONATE CLOTH 2 % EX PADS
6.0000 | MEDICATED_PAD | Freq: Every day | CUTANEOUS | Status: DC
  Administered 2021-02-02 – 2021-02-06 (×5): 6 via TOPICAL

## 2021-02-01 MED ORDER — ONDANSETRON HCL 4 MG/2ML IJ SOLN: 4.0000 mg | Freq: Four times a day (QID) | INTRAMUSCULAR | Status: DC | PRN

## 2021-02-01 MED ORDER — FUROSEMIDE 10 MG/ML IJ SOLN: 20.0000 mg | Freq: Two times a day (BID) | INTRAMUSCULAR | Status: DC

## 2021-02-01 MED ORDER — LACTATED RINGERS IV BOLUS (SEPSIS)
1000.0000 mL | Freq: Once | INTRAVENOUS | Status: AC
  Administered 2021-02-01: 1000 mL via INTRAVENOUS

## 2021-02-01 MED ORDER — SODIUM CHLORIDE 0.9 % IV SOLN
500.0000 mg | INTRAVENOUS | Status: DC
  Administered 2021-02-02 – 2021-02-03 (×2): 500 mg via INTRAVENOUS
  Filled 2021-02-01 (×3): qty 500

## 2021-02-01 MED ORDER — LACTATED RINGERS IV BOLUS (SEPSIS)
500.0000 mL | Freq: Once | INTRAVENOUS | Status: AC
  Administered 2021-02-01: 500 mL via INTRAVENOUS

## 2021-02-01 MED ORDER — ACETAMINOPHEN 650 MG RE SUPP: 650.0000 mg | Freq: Four times a day (QID) | RECTAL | Status: DC | PRN

## 2021-02-01 MED ORDER — MIDODRINE HCL 5 MG PO TABS: 2.5000 mg | ORAL_TABLET | Freq: Three times a day (TID) | ORAL | Status: DC

## 2021-02-01 MED ORDER — MAGNESIUM SULFATE 2 GM/50ML IV SOLN
2.0000 g | Freq: Once | INTRAVENOUS | Status: AC
  Administered 2021-02-02: 2 g via INTRAVENOUS
  Filled 2021-02-01: qty 50

## 2021-02-01 MED ORDER — VANCOMYCIN HCL 2000 MG/400ML IV SOLN
2000.0000 mg | Freq: Once | INTRAVENOUS | Status: AC
  Administered 2021-02-01: 2000 mg via INTRAVENOUS
  Filled 2021-02-01: qty 400

## 2021-02-01 MED ORDER — MIDAZOLAM BOLUS VIA INFUSION
2.0000 mg | INTRAVENOUS | Status: DC | PRN
  Administered 2021-02-04 (×2): 2 mg via INTRAVENOUS
  Filled 2021-02-01: qty 2

## 2021-02-01 MED ORDER — MIDAZOLAM 50MG/50ML (1MG/ML) PREMIX INFUSION
0.5000 mg/h | INTRAVENOUS | Status: DC
  Administered 2021-02-01 – 2021-02-02 (×3): 1 mg/h via INTRAVENOUS
  Filled 2021-02-01 (×2): qty 50

## 2021-02-01 MED ORDER — ROCURONIUM BROMIDE 50 MG/5ML IV SOLN
40.0000 mg | Freq: Once | INTRAVENOUS | Status: AC
  Administered 2021-02-01: 40 mg via INTRAVENOUS

## 2021-02-01 MED ORDER — CHLORHEXIDINE GLUCONATE 0.12% ORAL RINSE (MEDLINE KIT)
15.0000 mL | Freq: Two times a day (BID) | OROMUCOSAL | Status: DC
  Administered 2021-02-01 – 2021-02-06 (×10): 15 mL via OROMUCOSAL

## 2021-02-01 MED ORDER — TAMSULOSIN HCL 0.4 MG PO CAPS
0.4000 mg | ORAL_CAPSULE | Freq: Every day | ORAL | Status: DC
  Administered 2021-02-01 – 2021-02-05 (×3): 0.4 mg via ORAL
  Filled 2021-02-01 (×4): qty 1

## 2021-02-01 MED ORDER — VITAMIN B-12 1000 MCG PO TABS
1000.0000 ug | ORAL_TABLET | Freq: Every day | ORAL | Status: DC
  Administered 2021-02-01 – 2021-02-03 (×3): 1000 ug via ORAL
  Filled 2021-02-01 (×5): qty 1

## 2021-02-01 MED ORDER — MIDAZOLAM HCL 2 MG/2ML IJ SOLN
2.0000 mg | Freq: Once | INTRAMUSCULAR | Status: AC
  Administered 2021-02-01: 2 mg via INTRAVENOUS
  Filled 2021-02-01: qty 2

## 2021-02-01 MED ORDER — ALBUTEROL SULFATE (2.5 MG/3ML) 0.083% IN NEBU
2.5000 mg | INHALATION_SOLUTION | Freq: Four times a day (QID) | RESPIRATORY_TRACT | Status: DC
  Administered 2021-02-01 – 2021-02-04 (×10): 2.5 mg via RESPIRATORY_TRACT
  Filled 2021-02-01 (×10): qty 3

## 2021-02-01 MED ORDER — MIDODRINE HCL 5 MG PO TABS
5.0000 mg | ORAL_TABLET | Freq: Three times a day (TID) | ORAL | Status: DC
  Administered 2021-02-02 – 2021-02-06 (×12): 5 mg
  Filled 2021-02-01 (×12): qty 1

## 2021-02-01 MED ORDER — MIDODRINE HCL 5 MG PO TABS
10.0000 mg | ORAL_TABLET | Freq: Three times a day (TID) | ORAL | Status: DC
  Filled 2021-02-01 (×2): qty 2

## 2021-02-01 MED ORDER — VANCOMYCIN HCL IN DEXTROSE 1-5 GM/200ML-% IV SOLN
1000.0000 mg | Freq: Once | INTRAVENOUS | Status: DC
Start: 2021-02-01 — End: 2021-02-01

## 2021-02-01 MED ORDER — ORAL CARE MOUTH RINSE
15.0000 mL | OROMUCOSAL | Status: DC
  Administered 2021-02-01 – 2021-02-06 (×45): 15 mL via OROMUCOSAL

## 2021-02-01 MED ORDER — FENTANYL 2500MCG IN NS 250ML (10MCG/ML) PREMIX INFUSION
0.0000 ug/h | INTRAVENOUS | Status: DC
  Administered 2021-02-01: 300 ug/h via INTRAVENOUS
  Administered 2021-02-01: 275 ug/h via INTRAVENOUS
  Administered 2021-02-01: 200 ug/h via INTRAVENOUS
  Administered 2021-02-02 (×2): 250 ug/h via INTRAVENOUS
  Administered 2021-02-03: 25 ug/h via INTRAVENOUS
  Administered 2021-02-04: 150 ug/h via INTRAVENOUS
  Administered 2021-02-04 – 2021-02-05 (×2): 200 ug/h via INTRAVENOUS
  Administered 2021-02-05 – 2021-02-06 (×2): 250 ug/h via INTRAVENOUS
  Filled 2021-02-01 (×12): qty 250

## 2021-02-01 MED ORDER — MIDODRINE HCL 5 MG PO TABS
5.0000 mg | ORAL_TABLET | Freq: Three times a day (TID) | ORAL | Status: DC
  Administered 2021-02-01: 5 mg via ORAL
  Filled 2021-02-01 (×3): qty 1

## 2021-02-01 MED ORDER — ONDANSETRON HCL 4 MG PO TABS: 4.0000 mg | ORAL_TABLET | Freq: Four times a day (QID) | ORAL | Status: DC | PRN

## 2021-02-01 MED ORDER — VANCOMYCIN HCL 1250 MG/250ML IV SOLN
1250.0000 mg | INTRAVENOUS | Status: DC
  Administered 2021-02-02: 1250 mg via INTRAVENOUS
  Filled 2021-02-01: qty 250

## 2021-02-01 MED ORDER — HYDROCORTISONE NA SUCCINATE PF 100 MG IJ SOLR
100.0000 mg | Freq: Three times a day (TID) | INTRAMUSCULAR | Status: DC
  Administered 2021-02-01 – 2021-02-06 (×15): 100 mg via INTRAVENOUS
  Filled 2021-02-01 (×15): qty 2

## 2021-02-01 MED ORDER — ALLOPURINOL 300 MG PO TABS
300.0000 mg | ORAL_TABLET | Freq: Every day | ORAL | Status: DC
  Administered 2021-02-02 – 2021-02-03 (×2): 300 mg
  Filled 2021-02-01 (×3): qty 1

## 2021-02-01 MED ORDER — SENNOSIDES-DOCUSATE SODIUM 8.6-50 MG PO TABS: 1.0000 | ORAL_TABLET | Freq: Every evening | ORAL | Status: DC | PRN

## 2021-02-01 MED ORDER — FENTANYL BOLUS VIA INFUSION
50.0000 ug | INTRAVENOUS | Status: DC | PRN
  Administered 2021-02-04 – 2021-02-05 (×4): 50 ug via INTRAVENOUS
  Filled 2021-02-01: qty 50

## 2021-02-01 MED ORDER — POTASSIUM CHLORIDE 10 MEQ/100ML IV SOLN
10.0000 meq | INTRAVENOUS | Status: AC
  Administered 2021-02-01 (×4): 10 meq via INTRAVENOUS
  Filled 2021-02-01 (×4): qty 100

## 2021-02-01 MED ORDER — ALLOPURINOL 300 MG PO TABS
300.0000 mg | ORAL_TABLET | Freq: Every day | ORAL | Status: DC
  Administered 2021-02-01: 300 mg via ORAL
  Filled 2021-02-01 (×2): qty 1

## 2021-02-01 MED ORDER — FERROUS SULFATE 325 (65 FE) MG PO TABS
325.0000 mg | ORAL_TABLET | Freq: Every day | ORAL | Status: DC
  Administered 2021-02-01 – 2021-02-04 (×4): 325 mg via ORAL
  Filled 2021-02-01 (×4): qty 1

## 2021-02-01 MED ORDER — SODIUM CHLORIDE 0.9 % IV SOLN
2.0000 g | Freq: Two times a day (BID) | INTRAVENOUS | Status: DC
  Administered 2021-02-02 (×2): 2 g via INTRAVENOUS
  Filled 2021-02-01 (×4): qty 2

## 2021-02-01 MED ORDER — SODIUM CHLORIDE 0.9 % IV SOLN: 2.0000 g | Freq: Once | INTRAVENOUS | Status: DC

## 2021-02-01 MED ORDER — LATANOPROST 0.005 % OP SOLN
1.0000 [drp] | Freq: Every day | OPHTHALMIC | Status: DC
  Administered 2021-02-01 – 2021-02-05 (×5): 1 [drp] via OPHTHALMIC
  Filled 2021-02-01: qty 2.5

## 2021-02-01 MED ORDER — SODIUM CHLORIDE 0.9 % IV BOLUS
1000.0000 mL | Freq: Once | INTRAVENOUS | Status: AC
  Administered 2021-02-01: 1000 mL via INTRAVENOUS

## 2021-02-01 MED ORDER — ACETAMINOPHEN 325 MG PO TABS: 650.0000 mg | ORAL_TABLET | Freq: Four times a day (QID) | ORAL | Status: DC | PRN

## 2021-02-01 MED ORDER — ETOMIDATE 2 MG/ML IV SOLN
20.0000 mg | Freq: Once | INTRAVENOUS | Status: AC
  Administered 2021-02-01: 20 mg via INTRAVENOUS
  Filled 2021-02-01: qty 10

## 2021-02-01 MED ORDER — MIDAZOLAM HCL 2 MG/2ML IJ SOLN
INTRAMUSCULAR | Status: AC
  Administered 2021-02-01: 1 mg
  Filled 2021-02-01: qty 2

## 2021-02-01 MED ORDER — SODIUM CHLORIDE 0.9 % IV SOLN
2.0000 g | Freq: Once | INTRAVENOUS | Status: AC
  Administered 2021-02-01: 2 g via INTRAVENOUS
  Filled 2021-02-01: qty 2

## 2021-02-01 MED ORDER — FENTANYL CITRATE (PF) 100 MCG/2ML IJ SOLN: 50.0000 ug | Freq: Once | INTRAMUSCULAR | Status: DC

## 2021-02-01 MED ORDER — MIDAZOLAM HCL 2 MG/2ML IJ SOLN: 1.0000 mg | Freq: Once | INTRAMUSCULAR | Status: DC

## 2021-02-01 MED ORDER — POLYETHYLENE GLYCOL 3350 17 G PO PACK
17.0000 g | PACK | Freq: Every day | ORAL | Status: DC
  Administered 2021-02-01 – 2021-02-06 (×6): 17 g
  Filled 2021-02-01 (×6): qty 1

## 2021-02-01 MED ORDER — PANTOPRAZOLE SODIUM 40 MG IV SOLR
40.0000 mg | INTRAVENOUS | Status: DC
  Administered 2021-02-01 – 2021-02-05 (×5): 40 mg via INTRAVENOUS
  Filled 2021-02-01 (×5): qty 40

## 2021-02-01 NOTE — Progress Notes (Signed)
OT Cancellation Note  Patient Details Name: Harry Strong MRN: [id number] DOB: 13-Mar-1935   Cancelled Treatment:    Reason Eval/Treat Not Completed: Medical issues which prohibited therapy. Order received, chart reviewed. Pt is unable to tolerate removal of BiPAP. Will hold therapeutic intervention until pt is medically stable.  Josiah Lobo, PhD, MS, OTR/L 02/08/2021, 10:43 AM

## 2021-02-01 NOTE — Progress Notes (Addendum)
PROGRESS NOTE  Harry Strong PXT:[id number] DOB: 01/17/1935   PCP: Mechele Claude, FNP  Patient is from: Home  DOA: 01/21/2021 LOS: 0  Chief complaints: Shortness of breath  Brief Narrative / Interim history: 86 year old M with history of B cell lymphoma reportedly in remission, pancytopenia, hypertension, BPH, gout and recent hospitalization from 5/13-5/19 with sepsis and respiratory failure from multifocal pneumonia for which she was discharged on 2 L by nasal cannula returning with persistent cough, shortness of breath, BLE edema and weakness.  Initially saturating at 97% on 2 L but desaturated to 88% and started on BiPAP.  CXR with bilateral airspace disease, worse on the left since prior study.  Blood work with pancytopenia, hyponatremia to 123 and lactic acidosis to 3.  Procalcitonin elevated to 0.45.  BNP 92.  EKG with sinus rhythm at 92, RBBB and nonspecific ST and T wave changes.  Cultures obtained.  Patient was started on vancomycin and cefepime and admitted for severe sepsis and hypoxemic respiratory failure due to multifocal pneumonia, and hyponatremia.    Subjective: Seen and examined earlier this morning.  Slightly hypotensive but improved.  Continues to report productive cough with off-white phlegm and shortness of breath.  He denies chest pain, GI or UTI symptoms.  He had significant work of breathing when attempted to take him off BiPAP this morning.  He confirms full CODE STATUS.   Objective: Vitals:   02/05/2021 0807 01/31/2021 0900 01/31/2021 0930 01/28/2021 1000  BP: (!) 100/55 (!) 86/52 (!) 96/55 101/64  Pulse: (!) 107 97 88 100  Resp:  19 20 19   Temp:      TempSrc:      SpO2:  99% 98% 99%  Weight:      Height:       No intake or output data in the 24 hours ending 02/05/2021 1105 Filed Weights   01/26/2021 0228  Weight: 78.9 kg    Examination:  GENERAL: No apparent distress.  Nontoxic. HEENT: MMM.  Vision and hearing grossly intact.  NECK: Supple.  No apparent  JVD.  RESP: On BiPAP.  No IWOB.  Fair aeration bilaterally. CVS:  RRR. Heart sounds normal.  ABD/GI/GU: BS+. Abd soft, NTND.  MSK/EXT:  Moves extremities. No apparent deformity.  2+ pitting edema bilaterally. SKIN: no apparent skin lesion or wound NEURO: Awake, alert and oriented appropriately.  No apparent focal neuro deficit. PSYCH: Calm. Normal affect.   Procedures:  None  Microbiology summarized: [id number]-50 and influenza PCR nonreactive. Blood cultures NGTD. Urine culture pending. MRSA PCR screen pending. Sputum culture pending.  Assessment & Plan: Multifocal pneumonia-CXR with extensive b/l airspace disease, worse on the left compared to prior study. Severe sepsis: POA.  Due to the above Acute on chronic hypoxemic respiratory failure due to multifocal pneumonia and possible CHF -Sepsis physiology resolving.  Culture data as above. -Continue vancomycin and cefepime -Add azithromycin for atypical coverage -Discontinue IV fluid.  Trial of IV Lasix -Continue BiPAP as needed until he is able to tolerate nasal cannula.  Addendum Blood pressure dropped to 84/51.  Discontinued IV Lasix order.  Patient has over 2.5 L IVF so far.  Increased midodrine to 5 mg twice daily.  Cortisol level is pending.  Intensivist alerted.  She  History of B-cell lymphoma-reportedly in remission.  He says he was treated in [place] and anemia at baseline.  Platelet worse.  Absolute neutrophils 1.4K. -Check anemia panel -Continue monitoring -Hematology consult if worse.   Hyponatremia: SIADH.  His urine sodium  was elevated previous admission.  TSH within normal.  Improving. -Recheck urine chemistry including sodium and osmolality -Check a.m. cortisol   Hypotension: On midodrine at home.  Remains hypotensive despite IV fluid resuscitation. -Increased home midodrine to 5 mg 3 times daily -Discontinue IV fluid in the setting of BLE edema. -Intensivist notified.  BLE  edema-right heart failure?  Recent echo in 12/2020 with LVEF of 60 to 65%, G1 DD, moderately elevated PASP, moderate AS and mild to moderate MVR and TVR.  His BNP is within normal.  His anemia could play some role.  TSH within normal.  He also has history of B-cell lymphoma but reportedly in remission.  His albumin was 3.2 on admission. -Discontinue IV fluid -Trial of IV Lasix 20 mg twice daily -Monitor respiratory and fluid status as well as renal function.   Hypokalemia/hypomagnesemia -Replenish and recheck.  Generalized weakness/physical deconditioning -PT/OT eval.    Body mass index is 25.33 kg/m.         DVT prophylaxis:  SCDs Start: 01/31/2021 0513  Code Status: Full code-confirmed Family Communication: Patient and/or RN. Available if any question.  Level of care: Stepdown Status is: Inpatient  Remains inpatient appropriate because:Hemodynamically unstable, Persistent severe electrolyte disturbances, Ongoing diagnostic testing needed not appropriate for outpatient work up, IV treatments appropriate due to intensity of illness or inability to take PO, and Inpatient level of care appropriate due to severity of illness  Dispo: The patient is from: Home              Anticipated d/c is to: Home              Patient currently is not medically stable to d/c.   Difficult to place patient No       Consultants:  None   Sch Meds:  Scheduled Meds:  allopurinol  300 mg Oral Daily   ferrous sulfate  325 mg Oral Daily   furosemide  20 mg Intravenous BID   latanoprost  1 drop Both Eyes QHS   midodrine  5 mg Oral TID WC   tamsulosin  0.4 mg Oral Daily   vitamin B-12  1,000 mcg Oral Daily   Continuous Infusions:  azithromycin     ceFEPime (MAXIPIME) IV     magnesium sulfate bolus IVPB     potassium chloride     [START ON 02/02/2021] vancomycin     PRN Meds:.acetaminophen **OR** acetaminophen, ondansetron **OR** ondansetron (ZOFRAN) IV,  senna-docusate  Antimicrobials: Anti-infectives (From admission, onward)    Start     Dose/Rate Route Frequency Ordered Stop   02/02/21 0400  vancomycin (VANCOREADY) IVPB 1250 mg/250 mL        1,250 mg 166.7 mL/hr over 90 Minutes Intravenous Every 24 hours 02/03/2021 0534     01/21/2021 1600  ceFEPIme (MAXIPIME) 2 g in sodium chloride 0.9 % 100 mL IVPB        2 g 200 mL/hr over 30 Minutes Intravenous Every 12 hours 02/16/2021 0529     01/30/2021 1100  azithromycin (ZITHROMAX) 500 mg in sodium chloride 0.9 % 250 mL IVPB        500 mg 250 mL/hr over 60 Minutes Intravenous Every 24 hours 02/17/2021 1053 02-10-21 1059   02/08/2021 0515  vancomycin (VANCOCIN) IVPB 1000 mg/200 mL premix  Status:  Discontinued        1,000 mg 200 mL/hr over 60 Minutes Intravenous  Once 02/04/2021 0514 02/16/2021 0532   02/14/2021 0515  ceFEPIme (MAXIPIME) 2 g in sodium  chloride 0.9 % 100 mL IVPB  Status:  Discontinued        2 g 200 mL/hr over 30 Minutes Intravenous  Once 01/30/2021 0514 01/26/2021 0532   02/12/2021 0400  vancomycin (VANCOCIN) IVPB 1000 mg/200 mL premix  Status:  Discontinued        1,000 mg 200 mL/hr over 60 Minutes Intravenous  Once 01/24/2021 0346 02/02/2021 0359   01/28/2021 0400  ceFEPIme (MAXIPIME) 2 g in sodium chloride 0.9 % 100 mL IVPB        2 g 200 mL/hr over 30 Minutes Intravenous  Once 02/09/2021 0346 02/09/2021 0456   01/26/2021 0400  vancomycin (VANCOREADY) IVPB 2000 mg/400 mL        2,000 mg 200 mL/hr over 120 Minutes Intravenous  Once 02/03/2021 0359 02/13/2021 0626        I have personally reviewed the following labs and images: CBC: Recent Labs  Lab 02/03/2021 0255  WBC 1.6*  NEUTROABS 1.4*  HGB 9.9*  HCT 28.6*  MCV 76.7*  PLT 67*   BMP &GFR Recent Labs  Lab 01/27/2021 0255 01/21/2021 0724 01/26/2021 0824  NA 123*  --  125*  K 3.6  --  3.5  CL 93*  --  96*  CO2 23  --  22  GLUCOSE 183*  --  183*  BUN 24*  --  22  CREATININE 1.27*  --  1.21  CALCIUM 8.2*  --  7.4*  MG  --  1.5*  --   PHOS   --   --  3.3   Estimated Creatinine Clearance: 46.2 mL/min (by C-G formula based on SCr of 1.21 mg/dL). Liver & Pancreas: Recent Labs  Lab 01/31/2021 0255 01/30/2021 0824  AST 48*  --   ALT 21  --   ALKPHOS 70  --   BILITOT 1.0  --   PROT 5.2*  --   ALBUMIN 3.2* 2.3*   No results for input(s): LIPASE, AMYLASE in the last 168 hours. No results for input(s): AMMONIA in the last 168 hours. Diabetic: No results for input(s): HGBA1C in the last 72 hours. No results for input(s): GLUCAP in the last 168 hours. Cardiac Enzymes: No results for input(s): CKTOTAL, CKMB, CKMBINDEX, TROPONINI in the last 168 hours. No results for input(s): PROBNP in the last 8760 hours. Coagulation Profile: Recent Labs  Lab 01/30/2021 0255  INR 1.3*   Thyroid Function Tests: Recent Labs    02/12/2021 0724  TSH 1.118   Lipid Profile: No results for input(s): CHOL, HDL, LDLCALC, TRIG, CHOLHDL, LDLDIRECT in the last 72 hours. Anemia Panel: No results for input(s): VITAMINB12, FOLATE, FERRITIN, TIBC, IRON, RETICCTPCT in the last 72 hours. Urine analysis:    Component Value Date/Time   COLORURINE YELLOW (A) 01/27/2021 0440   APPEARANCEUR HAZY (A) 01/22/2021 0440   LABSPEC 1.020 01/30/2021 0440   PHURINE 5.0 01/30/2021 0440   GLUCOSEU 50 (A) 02/11/2021 0440   HGBUR SMALL (A) 02/15/2021 0440   BILIRUBINUR NEGATIVE 02/15/2021 0440   KETONESUR NEGATIVE 02/09/2021 0440   PROTEINUR 100 (A) 02/16/2021 0440   NITRITE NEGATIVE 01/31/2021 0440   LEUKOCYTESUR NEGATIVE 01/26/2021 0440   Sepsis Labs: Invalid input(s): PROCALCITONIN, [place]  Microbiology: Recent Results (from the past 240 hour(s))  Blood Culture (routine x 2)     Status: None (Preliminary result)   Collection Time: 01/26/2021  2:55 AM   Specimen: BLOOD  Result Value Ref Range Status   Specimen Description BLOOD RIGHT ANTECUBITAL  Final   Special  Requests   Final    BOTTLES DRAWN AEROBIC AND ANAEROBIC Blood Culture adequate volume    Culture   Final    NO GROWTH < 12 HOURS Performed at [facility], [address] [place], [place] [address]    Report Status PENDING  Incomplete  Blood Culture (routine x 2)     Status: None (Preliminary result)   Collection Time: 01/26/2021  2:55 AM   Specimen: BLOOD  Result Value Ref Range Status   Specimen Description BLOOD  RIGHT HAND  Final   Special Requests   Final    BOTTLES DRAWN AEROBIC AND ANAEROBIC Blood Culture results may not be optimal due to an inadequate volume of blood received in culture bottles   Culture   Final    NO GROWTH < 12 HOURS Performed at [facility], [address] [place], [place] [address]    Report Status PENDING  Incomplete  Resp Panel by RT-PCR (Flu A&B, Covid) Nasopharyngeal Swab     Status: None   Collection Time: 02/19/2021  2:55 AM   Specimen: Nasopharyngeal Swab; Nasopharyngeal(NP) swabs in vial transport medium  Result Value Ref Range Status   SARS Coronavirus 2 by RT PCR NEGATIVE NEGATIVE Final    Comment: (NOTE) SARS-CoV-2 target nucleic acids are NOT DETECTED.  The SARS-CoV-2 RNA is generally detectable in upper respiratory specimens during the acute phase of infection. The lowest concentration of SARS-CoV-2 viral copies this assay can detect is 138 copies/mL. A negative result does not preclude SARS-Cov-2 infection and should not be used as the sole basis for treatment or other patient management decisions. A negative result may occur with  improper specimen collection/handling, submission of specimen other than nasopharyngeal swab, presence of viral mutation(s) within the areas targeted by this assay, and inadequate number of viral copies(<138 copies/mL). A negative result must be combined with clinical observations, patient history, and epidemiological information. The expected result is Negative.  Fact Sheet for Patients:  EntrepreneurPulse.com.au  Fact Sheet for Healthcare Providers:   IncredibleEmployment.be  This test is no t yet approved or cleared by the [place] FDA and  has been authorized for detection and/or diagnosis of SARS-CoV-2 by FDA under an Emergency Use Authorization (EUA). This EUA will remain  in effect (meaning this test can be used) for the duration of the COVID-19 declaration under Section 564(b)(1) of the Act, 21 U.S.C.section 360bbb-3(b)(1), unless the authorization is terminated  or revoked sooner.       Influenza A by PCR NEGATIVE NEGATIVE Final   Influenza B by PCR NEGATIVE NEGATIVE Final    Comment: (NOTE) The Xpert Xpress SARS-CoV-2/FLU/RSV plus assay is intended as an aid in the diagnosis of influenza from Nasopharyngeal swab specimens and should not be used as a sole basis for treatment. Nasal washings and aspirates are unacceptable for Xpert Xpress SARS-CoV-2/FLU/RSV testing.  Fact Sheet for Patients: EntrepreneurPulse.com.au  Fact Sheet for Healthcare Providers: IncredibleEmployment.be  This test is not yet approved or cleared by the [place] FDA and has been authorized for detection and/or diagnosis of SARS-CoV-2 by FDA under an Emergency Use Authorization (EUA). This EUA will remain in effect (meaning this test can be used) for the duration of the COVID-19 declaration under Section 564(b)(1) of the Act, 21 U.S.C. section 360bbb-3(b)(1), unless the authorization is terminated or revoked.  Performed at [facility], [address], [place], [place] [address]     Radiology Studies: [facility] Chest [place] 1 View  Result Date: 02/17/2021 CLINICAL  DATA:  Questionable sepsis EXAM: PORTABLE CHEST 1 VIEW COMPARISON:  01/02/2021 FINDINGS: Extensive bilateral airspace disease throughout both lungs, most confluent in the right upper lobe and diffusely throughout the left lung. This is worsened throughout the left lung since prior study. Findings are concerning for  multifocal pneumonia. Right Port-A-Cath remains in place, unchanged. Heart is normal size. No visible effusions or pneumothorax. IMPRESSION: Extensive bilateral airspace disease, worsening on the left since prior study. Findings are concerning for multifocal pneumonia. Electronically Signed   By: Rolm Baptise M.D.   On: 01/31/2021 03:06     Additional 35 minutes with more than 50% spent in reviewing records, counseling patient/family and coordinating care.   Isaak Delmundo T. [place]  If 7PM-7AM, please contact night-coverage [email or web address] 02/12/2021, 11:05 AM

## 2021-02-01 NOTE — Progress Notes (Signed)
PT Cancellation Note  Patient Details Name: Ethaniel Garfield MRN: [id number] DOB: 1935/03/23   Cancelled Treatment:    Reason Eval/Treat Not Completed: Medical issues which prohibited therapy. PT orders received and pt chart reviewed. Per most recent RN note this AM, pt unable to tolerate removal of BiPAP. Will hold therapeutic intervention until pt is medically stable.  Herminio Commons, PT, DPT 8:13 AM,02/13/2021

## 2021-02-01 NOTE — ED Notes (Signed)
Pt placed on 70% bipap, sats 97%, mottled skin decreasing, resp rate decreasing

## 2021-02-01 NOTE — H&P (Signed)
History and Physical    Harry Strong TFT:[id number] DOB: 1934-11-27 DOA: 02/09/2021  PCP: Mechele Claude, FNP   Patient coming from: Home  I have personally briefly reviewed patient's old medical records in [place]  Chief Complaint: Shortness of breath  HPI: Harry Strong is a 86 y.o. male with medical history significant for B-cell lymphoma, BPH, gout, pancytopenia, chronic hypotension on midodrine, hospitalized from 5/13-5/19 with sepsis, multifocal pneumonia and respiratory failure, discharged on home O2 at 2 L who presents to the emergency room with persistent symptoms of cough, shortness of breath and overall decline.  Additionally he noticed new bilateral lower extremity swelling over the past week and acute worsening of his shortness of breath in the past 24 hours prompting his visit to the hospital.  He denies chest pain, fever or chills.  Denies nausea, vomiting or abdominal pain.  Denies diarrhea and dysuria ED course: On arrival, tachypneic at 22-29 with O2 sat initially 97% on 2 L in the ER but following IV fluid bolus dropped to 88% on 2 L.  He was afebrile, BP 85/61 with pulse 93 Blood work significant for pancytopenia with WBC 1600, with lactic acid of 3 hemoglobin 9.1 and platelets 67,000.  Sodium 123, creatinine 1.27 up from 0.97 baseline.  Procalcitonin 0.45.  BNP 92 EKG, personally viewed and interpreted sinus at 92 with RBBB and nonspecific ST-T wave changes Imaging: Chest x-ray with extensive bilateral airspace disease, worsening on the left since prior study.  Findings are concerning for multifocal pneumonia  Patient was initiated on sepsis protocol with IV fluid bolus and IV antibiotics.  He started having increased work of breathing with O2 sats down to 88% and he was subsequently placed on BiPAP.  Hospitalist consulted for admission. Review of Systems: As per HPI otherwise all other systems on review of systems negative.    History reviewed. No  pertinent past medical history.  History reviewed. No pertinent surgical history.   reports that he has never smoked. He has never used smokeless tobacco. He reports previous alcohol use. He reports that he does not use drugs.  Allergies  Allergen Reactions   Aspirin Swelling   Tylenol [Acetaminophen] Other (See Comments)    Red face, tongue, and lips    History reviewed. No pertinent family history.    Prior to Admission medications   Medication Sig Start Date End Date Taking? Authorizing Provider  albuterol (VENTOLIN HFA) 108 (90 Base) MCG/ACT inhaler Inhale 2 puffs into the lungs every 6 (six) hours as needed for wheezing or shortness of breath. 01/08/21   Regalado, Belkys A, MD  allopurinol (ZYLOPRIM) 300 MG tablet Take 1 tablet (300 mg total) by mouth daily. 01/08/21   Regalado, Belkys A, MD  cefdinir (OMNICEF) 300 MG capsule Take 1 capsule (300 mg total) by mouth every 12 (twelve) hours. 01/08/21   Regalado, Belkys A, MD  dextromethorphan-guaiFENesin (MUCINEX DM) 30-600 MG 12hr tablet Take 1 tablet by mouth 2 (two) times daily. 01/08/21   Regalado, Belkys A, MD  ferrous sulfate 325 (65 FE) MG tablet Take 1 mg by mouth daily.    [provider]  latanoprost (XALATAN) 0.005 % ophthalmic solution Place 1 drop into both eyes at bedtime.    [provider]  magnesium gluconate (MAGONATE) 500 MG tablet Take 1 tablet (500 mg total) by mouth daily. 01/08/21   Regalado, Belkys A, MD  midodrine (PROAMATINE) 2.5 MG tablet Take 1 tablet (2.5 mg total) by mouth 3 (three) times daily  with meals. 01/08/21   Regalado, Belkys A, MD  tamsulosin (FLOMAX) 0.4 MG CAPS capsule Take 1 capsule (0.4 mg total) by mouth daily. 01/08/21   Regalado, Belkys A, MD  vitamin B-12 (CYANOCOBALAMIN) 1000 MCG tablet Take 1,000 mcg by mouth daily.    [provider]    Physical Exam: Vitals:   02/09/2021 0330 01/22/2021 0430 01/25/2021 0518 02/13/2021 0524  BP: (!) 102/59 (!) 149/83 130/86   Pulse: 89  95 (!) 112 (!) 115  Resp: (!) 22 (!) 24 (!) 28 (!) 27  Temp:  98.3 F (36.8 C)    TempSrc:  Oral    SpO2: 95% 94% (!) 81% 96%  Weight:      Height:         Vitals:   02/11/2021 0330 01/26/2021 0430 02/13/2021 0518 01/27/2021 0524  BP: (!) 102/59 (!) 149/83 130/86   Pulse: 89 95 (!) 112 (!) 115  Resp: (!) 22 (!) 24 (!) 28 (!) 27  Temp:  98.3 F (36.8 C)    TempSrc:  Oral    SpO2: 95% 94% (!) 81% 96%  Weight:      Height:          Constitutional: Frail and ill-appearing and oriented x 3 .  Moderate respiratory distress, on BiPAP HEENT:      Head: Normocephalic and atraumatic.         Eyes: PERLA, EOMI, Conjunctivae are normal. Sclera is non-icteric.       Mouth/Throat: Mucous membranes are moist.       Neck: Supple with no signs of meningismus. Cardiovascular: Tachycardic. No murmurs, gallops, or rubs. 2+ symmetrical distal pulses are present . No JVD.  3+ LE edema Respiratory: Respiratory effort increased.Lungs sounds diminished bilaterally.  Coarse breath sounds crackles bilaterally gastrointestinal: Soft, non tender, and non distended with positive bowel sounds.  Genitourinary: No CVA tenderness. Musculoskeletal: Nontender with normal range of motion in all extremities. No cyanosis, or erythema of extremities. Neurologic:  Face is symmetric. Moving all extremities. No gross focal neurologic deficits . Skin: Skin is warm, dry.  No rash or ulcers Psychiatric: Mood and affect are normal    Labs on Admission: I have personally reviewed following labs and imaging studies  CBC: Recent Labs  Lab 02/15/2021 0255  WBC 1.6*  NEUTROABS 1.4*  HGB 9.9*  HCT 28.6*  MCV 76.7*  PLT 67*   Basic Metabolic Panel: Recent Labs  Lab 02/13/2021 0255  NA 123*  K 3.6  CL 93*  CO2 23  GLUCOSE 183*  BUN 24*  CREATININE 1.27*  CALCIUM 8.2*   GFR: Estimated Creatinine Clearance: 44 mL/min (A) (by C-G formula based on SCr of 1.27 mg/dL (H)). Liver Function Tests: Recent Labs  Lab  01/26/2021 0255  AST 48*  ALT 21  ALKPHOS 70  BILITOT 1.0  PROT 5.2*  ALBUMIN 3.2*   No results for input(s): LIPASE, AMYLASE in the last 168 hours. No results for input(s): AMMONIA in the last 168 hours. Coagulation Profile: Recent Labs  Lab 01/23/2021 0255  INR 1.3*   Cardiac Enzymes: No results for input(s): CKTOTAL, CKMB, CKMBINDEX, TROPONINI in the last 168 hours. BNP (last 3 results) No results for input(s): PROBNP in the last 8760 hours. HbA1C: No results for input(s): HGBA1C in the last 72 hours. CBG: No results for input(s): GLUCAP in the last 168 hours. Lipid Profile: No results for input(s): CHOL, HDL, LDLCALC, TRIG, CHOLHDL, LDLDIRECT in the last 72 hours. Thyroid Function Tests: No  results for input(s): TSH, T4TOTAL, FREET4, T3FREE, THYROIDAB in the last 72 hours. Anemia Panel: No results for input(s): VITAMINB12, FOLATE, FERRITIN, TIBC, IRON, RETICCTPCT in the last 72 hours. Urine analysis:    Component Value Date/Time   COLORURINE YELLOW (A) 02/05/2021 0440   APPEARANCEUR HAZY (A) 02/08/2021 0440   LABSPEC 1.020 02/18/2021 0440   PHURINE 5.0 02/08/2021 0440   GLUCOSEU 50 (A) 02/12/2021 0440   HGBUR SMALL (A) 02/02/2021 0440   BILIRUBINUR NEGATIVE 01/29/2021 0440   KETONESUR NEGATIVE 02/02/2021 0440   PROTEINUR 100 (A) 02/14/2021 0440   NITRITE NEGATIVE 01/29/2021 0440   LEUKOCYTESUR NEGATIVE 02/12/2021 0440    Radiological Exams on Admission: DG Chest Port 1 View  Result Date: 01/22/2021 CLINICAL DATA:  Questionable sepsis EXAM: PORTABLE CHEST 1 VIEW COMPARISON:  01/02/2021 FINDINGS: Extensive bilateral airspace disease throughout both lungs, most confluent in the right upper lobe and diffusely throughout the left lung. This is worsened throughout the left lung since prior study. Findings are concerning for multifocal pneumonia. Right Port-A-Cath remains in place, unchanged. Heart is normal size. No visible effusions or pneumothorax. IMPRESSION: Extensive  bilateral airspace disease, worsening on the left since prior study. Findings are concerning for multifocal pneumonia. Electronically Signed   By: Rolm Baptise M.D.   On: 01/30/2021 03:06     Assessment/Plan 86 year old male with history of B-cell lymphoma, BPH, gout, pancytopenia, chronic hypotension on midodrine, hospitalized from 5/13-5/19 with sepsis, multifocal pneumonia and respiratory failure, discharged on home O2 at 2 L who presenting with shortness of breath.      Multifocal pneumonia   Severe sepsis (HCC)   Acute on chronic respiratory failure with hypoxia  - Patient with multifocal pneumonia, AKI, respiratory failure, leukopenic but with lactic acid of 3.  Hypotensive on arrival - Continue sepsis fluids - Continue IV cefepime and vancomycin - Continue BiPAP and wean as tolerated to to O2 via nasal cannula    Pancytopenia  History of B-cell lymphoma - Stable leukopenia and anemia.  Platelets downtrending, now 67,000 from 117,003 weeks prior - Continue to monitor - Neutropenic precautions - Hematology consult in the a.m.    Hyponatremia - Suspect SIADH secondary to pneumonia - Continue to monitor  Chronic medical problems BPH/gout - Stable continue home meds    DVT prophylaxis: SCDs due to thrombocytopenia Code Status: full code  Family Communication:  none  Disposition Plan: Back to previous home environment Consults called: none  Status:At the time of admission, it appears that the appropriate admission status for this patient is INPATIENT. This is judged to be reasonable and necessary in order to provide the required intensity of service to ensure the patient's safety given the presenting symptoms, physical exam findings, and initial radiographic and laboratory data in the context of their  Comorbid conditions.   Patient requires inpatient status due to high intensity of service, high risk for further deterioration and high frequency of surveillance required.   I  certify that at the point of admission it is my clinical judgment that the patient will require inpatient hospital care spanning beyond [place] MD [facility]     02/13/2021, 5:38 AM

## 2021-02-01 NOTE — Procedures (Signed)
PROCEDURE: BRONCHOSCOPY with bronchoalveolar lavage  PROCEDURE DATE: 01/21/2021  TIME:  NAME:  Teddie Mehta  DOB:[birth year]  MRN: [id number] LOC:  IC08A/IC08A-AA    HOSP DAY: _0      Indications/Preliminary Diagnosis:  Patient with anaplastic large cell lymphoma and pancytopenia with bilateral pulmonary infiltrates.  Rule out opportunistic infection versus lymphoma.   Consent: (Place X beside choice/s below)  The benefits, risks and possible complications of the procedure were        explained to:  __    patient  ___ patient's family  ___ other:  who verbalized understanding and gave:  ___ verbal  ___ written  ___ verbal and written  ___ telephone  ___ other:________ consent.    X  Unable to obtain consent; procedure performed on emergent basis.     Other:       PRESEDATION ASSESSMENT: History and Physical has been performed. Patient meds and allergies have been reviewed.  Patient was intubated and mechanically ventilated respiratory failure.  Baseline vital signs, sedation score, oxygenation status, and cardiac rhythm were reviewed.   Sedation: Patient is currently on fentanyl infusion intubated and mechanically ventilated.   PROCEDURE DETAILS: Timeout performed and correct patient, name, & ID confirmed.  Patient was intubated previously.  A Portex adapter was placed over the existing ET tube.  The Ambu bedside bronchoscope was inserted via the Portex adapter through the ET tube into the airway.  Airway exam proceeded with findings with no endobronchial lesions noted.  No endobronchial lesions were noted throughout the tracheobronchial tree.  Some scant purulent secretions noted on the left lingula bronchus.  The bronchoscope was then wedged on the lingula bronchus and bronchoalveolar lavage was performed by instilling 40 mL of saline, initial approximately 8 mL.  A second bronchoalveolar lavage was performed in this area with another 40 mL of saline instilled  yielding approximately 9 mL of aliquot.  The aliquot appeared yellowish.  No heme noted.  Hemostasis was excellent, blood loss was nil.  At the end of exam the scope was withdrawn without incident. Impression and Plan as noted below.     Insertion Route (Place X beside choice below)   Nasal   Oral  X Endotracheal Tube   Tracheostomy   INTRAPROCEDURE MEDICATIONS: Patient had residual sedation from intubation, was on fentanyl infusion.  TECHNICAL PROCEDURES: (Place X beside choice below)   Procedures  Description    None     Electrocautery     Cryotherapy     Balloon Dilatation   X Bronchoalveolar lavage     Stent Placement     Therapeutic Aspiration     Laser/Argon Plasma    Brachytherapy Catheter Placement    Foreign Body Removal         SPECIMENS (Sites): (Place X beside choice below)  Specimens Description   No Specimens Obtained     Washings   X Lavage Lingula (left)   Biopsies    Fine Needle Aspirates    Brushings    Sputum    FINDINGS: No overt endobronchial lesions.  No airway inflammation or friability. ESTIMATED BLOOD LOSS: none COMPLICATIONS/RESOLUTION: none  IMPRESSION:POST-PROCEDURE DX:  Bilateral pulmonary infiltrates Acute respiratory failure with hypoxia  RECOMMENDATION/PLAN:  BAL sent for cell count differential, routine cultures, AFB and fungal cultures, viral culture, pneumocystis PCR and cytology.  Await results.   Renold Don, MD [facility]   *This note was dictated using voice recognition software/Dragon.  Despite best efforts to proofread, errors can occur which  can change the meaning.  Any change was purely unintentional.

## 2021-02-01 NOTE — Consult Note (Signed)
NAME:  Harry Strong, MRN:  [id number], DOB:  August 12, 1935, LOS: 0 ADMISSION DATE:  01/21/2021, CONSULTATION DATE: 02/18/2021 REFERRING MD:  Harry Beavers MD, CHIEF COMPLAINT: Of breath, hypoxia  History of Present Illness:  The patient is an 86 year old lifelong never smoker with a history as noted below who presented to [facility] in the early morning hours due to increasing shortness of breath.  He had been recently hospitalized at [facility] from 13 May through 19 May with presumed sepsis, presumed multifocal pneumonia and acute respiratory failure with hypoxia.  Received antibiotics and was discharged home on oxygen 2 L/min.  The patient presented to the emergency room in the early morning hours of today with persistent symptoms of cough, shortness of breath and increased work of breathing.  The patient also had noted lower extremity edema as well.  ED course was noted as follows: ED course: On arrival, tachypneic at 22-29 with O2 sat initially 97% on 2 L in the ER but following IV fluid bolus dropped to 88% on 2 L.  He was afebrile, BP 85/61 with pulse 93 Blood work significant for pancytopenia with WBC 1600, with lactic acid of 3 hemoglobin 9.1 and platelets 67,000.  Sodium 123, creatinine 1.27 up from 0.97 baseline.  Procalcitonin 0.45.  BNP 92 Imaging: Chest x-ray with extensive bilateral airspace disease, worsening on the left since prior study.  Findings are concerning for multifocal pneumonia.  Current imaging as well as prior imaging was independently reviewed.  The patient was initially treated with sepsis protocol with IV fluids and IV antibiotics.  He was having increased work of breathing and oxygen desaturations and was subsequently placed on BiPAP.  He was initially admitted by the hospitalist group to go to stepdown on BiPAP.  However upon transfer to the stepdown unit the patient continued to have increased work of breathing and oxygen desaturations despite BiPAP.  At that point PCCM was consulted.   On evaluation at the bedside the patient was exceedingly tachypneic, with severe conversational dyspnea.  He was however alert and oriented but in moderate to severe respiratory distress.  Intubation was explained to the patient.  He reiterated that he wanted to be full code.  Was recently relocated to the area from [place].  Has an extensive oncologic history treated at [facility].  We managed to procure records which are under the care everywhere tab.  The patient is single and has a close friend in the area by the name of Harry Strong whom he share information with.  He does not mention any other relatives.  Further history could not be obtained due to the patient's severity of illness.  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 rate (!) 26, height 5' 9.5" (1.765 m), weight 77.4 kg, SpO2 96 %.    FiO2 (%):  [70 %] 70 %  No intake or output data in  the 24 hours ending 01/30/2021 1214 Filed Weights   01/23/2021 0228 02/16/2021 1136  Weight: 78.9 kg 77.4 kg   Examination: GENERAL: Chronically ill-appearing elderly male, in moderate to severe respiratory distress, severe conversational dyspnea even on BiPAP. HEAD: Normocephalic, atraumatic.  EYES: Pupils equal, round, reactive to light.  No scleral icterus.  MOUTH: Oral mucosa  moist, dentition intact. NECK: Supple. No thyromegaly. Trachea midline. No JVD.  No adenopathy. PULMONARY: Good air entry bilaterally.  Rhonchi throughout, no wheezes  CARDIOVASCULAR: Tachycardic with frequent extrasystoles.  Cardiac sounds obscured by pulmonary sounds ABDOMEN: Nondistended, soft, nontender, no hepatomegaly, palpable spleen. MUSCULOSKELETAL: No joint deformity, no clubbing, +1 to +2 edema lower extremities.  NEUROLOGIC: Awake, alert, no overt focal deficit  SKIN: Intact,warm,dry.  Poor skin turgor. PSYCH: Mood anxious  Labs/imaging that I havepersonally reviewed  (right click and "Reselect all SmartList Selections" daily)  Lab data as noted below was reviewed by me.  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, pancytopenia Patient is an immunocompromised host Will require intubation, mechanical ventilation VAP protocol once on vent Sedation protocol once on vent We will proceed with bronchoscopy after intubation BAL for cultures, cytology, cell count, pneumocystis PCR-see orders Failure to respond to antibiotics points to opportunistic infection versus malignancy Fungitell, CMV, histo PCR/titers Trend chest x-ray, ABG as needed Trend procalcitonin, baseline: 0.45 Trend inflammatory markers  Shock, hypovolemic, versus septic History of hypotension Volume resuscitate Pressors as needed to maintain MAP>65 Stress dose steroids Continue home midodrine  Lactic acidosis Volume resuscitate Ensure adequate perfusion Pressors as needed Trend lactic acid  Refractory Anaplastic Large Cell Lymphoma Pancytopenia, coagulopathy Records from [facility] procured Reviewed records Prognosis guarded Obtain Medical Oncology  consultation -discussed with Dr. Grayland Strong via secure chat Oncology Palliative Care consultation Apprised patient's friend with regards to guarded prognosis  Hyponatremia Likely related to hypovolemia Spot urine sodium and chloride Volume resuscitate with NS/crystalloid Monitor sodium  Further impression and plan pending review of laboratory data requested.  Please see orders for details.  Best practice (right click and "Reselect all SmartList Selections" daily)  Diet:  NPO Pain/Anxiety/Delirium protocol (if indicated): Yes (RASS goal -2) VAP protocol (if indicated): Yes DVT prophylaxis: Contraindicated pancytopenia/coagulopathy GI prophylaxis: PPI Glucose control:  SSI No Central venous access:  N/A patient does have port in place (present PTA) Arterial line:  N/A Foley:  Yes, and it is still needed Mobility:  bed rest  PT consulted: N/A Last date of multidisciplinary goals of care discussion [6/12] Code Status:  full code Disposition: ICU  Labs   CBC: Recent Labs  Lab 01/26/2021 0255  WBC 1.6*  NEUTROABS 1.4*  HGB 9.9*  HCT 28.6*  MCV 76.7*  PLT 67*    Basic Metabolic Panel: Recent Labs  Lab 01/28/2021 0255 02/16/2021 0724 02/11/2021 0824  NA 123*  --  125*  K 3.6  --  3.5  CL 93*  --  96*  CO2 23  --  22  GLUCOSE 183*  --  183*  BUN 24*  --  22  CREATININE 1.27*  --  1.21  CALCIUM 8.2*  --  7.4*  MG  --  1.5*  --   PHOS  --   --  3.3   GFR: Estimated Creatinine Clearance: 46.2 mL/min (by  C-G formula based on SCr of 1.21 mg/dL). Recent Labs  Lab 02/10/2021 0255 02/03/2021 0606  PROCALCITON 0.45  --   WBC 1.6*  --   LATICACIDVEN 3.0* 1.5    Liver Function Tests: Recent Labs  Lab 01/26/2021 0255 01/23/2021 0824  AST 48*  --   ALT 21  --   ALKPHOS 70  --   BILITOT 1.0  --   PROT 5.2*  --   ALBUMIN 3.2* 2.3*   No results for input(s): LIPASE, AMYLASE in the last 168 hours. No results for input(s): AMMONIA in the last 168 hours.  ABG No results found  for: PHART, PCO2ART, PO2ART, HCO3, TCO2, ACIDBASEDEF, O2SAT   Coagulation Profile: Recent Labs  Lab 02/17/2021 0255  INR 1.3*    Cardiac Enzymes: No results for input(s): CKTOTAL, CKMB, CKMBINDEX, TROPONINI in the last 168 hours.  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   Allergies Allergies  Allergen Reactions   Aspirin Swelling   Tylenol [Acetaminophen] Other (See Comments)    Red face, tongue, and lips     Home Medications  Prior to Admission medications   Medication Sig Start Date End Date Taking? Authorizing Provider  albuterol (VENTOLIN HFA) 108 (90 Base) MCG/ACT inhaler Inhale 2 puffs into the lungs every 6 (six) hours as needed for wheezing or shortness of breath. 01/08/21  Yes Regalado, Belkys A, MD  allopurinol (ZYLOPRIM) 300 MG tablet Take 1 tablet (300 mg total) by mouth daily. 01/08/21  Yes Regalado, Belkys A, MD  dextromethorphan-guaiFENesin (MUCINEX DM) 30-600 MG 12hr tablet Take 1 tablet by mouth 2 (two) times daily. Patient taking differently: Take 1 tablet by mouth 2 (two) times daily as needed for cough. 01/08/21  Yes Regalado, Belkys A, MD  ferrous  sulfate 325 (65 FE) MG tablet Take 325 mg by mouth daily.   Yes [provider]  latanoprost (XALATAN) 0.005 % ophthalmic solution Place 1 drop into both eyes at bedtime.   Yes [provider]  magnesium gluconate (MAGONATE) 500 MG tablet Take 1 tablet (500 mg total) by mouth daily. 01/08/21  Yes Regalado, Belkys A, MD  midodrine (PROAMATINE) 2.5 MG tablet Take 1 tablet (2.5 mg total) by mouth 3 (three) times daily with meals. 01/08/21  Yes Regalado, Belkys A, MD  tamsulosin (FLOMAX) 0.4 MG CAPS capsule Take 1 capsule (0.4 mg total) by mouth daily. 01/08/21  Yes Regalado, Belkys A, MD  vitamin B-12 (CYANOCOBALAMIN) 1000 MCG tablet Take 1,000 mcg by mouth daily.   Yes [provider]    Scheduled Meds:  albuterol  2.5 mg Nebulization Q6H   allopurinol  300 mg Oral Daily   chlorhexidine gluconate (MEDLINE KIT)  15 mL Mouth Rinse BID   [START ON 02/02/2021] Chlorhexidine Gluconate Cloth  6 each Topical Q0600   fentaNYL (SUBLIMAZE) injection  50 mcg Intravenous Once   ferrous sulfate  325 mg Oral Daily   hydrocortisone sod succinate (SOLU-CORTEF) inj  100 mg Intravenous Q8H   latanoprost  1 drop Both Eyes QHS   mouth rinse  15 mL Mouth Rinse 10 times per day   midazolam  1 mg Intravenous Once   midodrine  5 mg Oral TID WC   pantoprazole (PROTONIX) IV  40 mg Intravenous Q24H   tamsulosin  0.4 mg Oral Daily   vitamin B-12  1,000 mcg Oral Daily   Continuous Infusions:  azithromycin     ceFEPime (MAXIPIME) IV     fentaNYL infusion INTRAVENOUS 300 mcg/hr (02/05/2021 1553)   lactated ringers 125 mL/hr at 01/26/2021 1325   magnesium sulfate bolus IVPB     midazolam 1 mg/hr (01/31/2021 1614)   norepinephrine (LEVOPHED) Adult infusion 5 mcg/min (02/10/2021 1538)   potassium chloride 10 mEq (01/30/2021 1719)   [START ON 02/02/2021] vancomycin     PRN Meds:.acetaminophen **OR** acetaminophen, ondansetron **OR** ondansetron (ZOFRAN) IV, senna-docusate, sodium chloride flush  Critical  care time: 90 minutes    The patient is critically ill with multiple organ systems failure and requires high complexity decision making for assessment and support, frequent evaluation and titration of therapies, application of advanced monitoring technologies and extensive interpretation of multiple databases. Critical Care Time devoted to patient care services described in this note is 90 minutes.  Renold Don, MD [place] [facility]   *This note was dictated using voice recognition software/Dragon.  Despite best efforts to proofread, errors can occur which can change the meaning.  Any change was purely unintentional.

## 2021-02-01 NOTE — Progress Notes (Signed)
Sepsis tacking by [facility]

## 2021-02-01 NOTE — ED Notes (Signed)
Admitting BP made aware of hypotension. Additional orders received. Pt unable to tolerate removal of BiPAP at this time per RT. Unsuccessfully trial of nasal canula with increased work of breathing.

## 2021-02-01 NOTE — ED Provider Notes (Signed)
[facility] Emergency Department Provider Note  ____________________________________________   Event Date/Time   First MD Initiated Contact with Patient 01/31/2021 0234     (approximate)  I have reviewed the triage vital signs and the nursing notes.   HISTORY  Chief Complaint Shortness of Breath  Level 5 caveat:  history/ROS limited by acute/critical illness  HPI Harry Strong is a 86 y.o. male with history of multifocal pneumonia for which she was admitted to the hospital about a month ago.  He presents tonight for several days of gradually worsening shortness of breath.  Prior to his admission a month ago he did not have an oxygen requirement but he was discharged on 2 L with home health.  He had a follow-up appointment a few days ago with his primary care doctor but said he has gradually been feeling worse since that time.  He said that his shortness of breath is worse with any amount of exertion.  He is also noticed bilateral lower extremity swelling which is new for him over maybe the last week.  He has a rash on his chest that he said has been present for several weeks and does not cause him any trouble.  His shortness of breath is considerably worse tonight which is why he came to the hospital.  He also notes that his blood pressure is low.  He denies any pain including in his chest and his abdomen.  Nothing in particular makes his shortness of breath better.     No past medical history on file.  Patient Active Problem List   Diagnosis Date Noted  . Severe sepsis ([place]) 01/25/2021  . Hyponatremia 02/18/2021  . Pneumonia 02/11/2021  . Pancytopenia ([place]) 01/08/2021  . Multifocal pneumonia 01/02/2021    No past surgical history on file.  Prior to Admission medications   Medication Sig Start Date End Date Taking? Authorizing Provider  albuterol (VENTOLIN HFA) 108 (90 Base) MCG/ACT inhaler Inhale 2 puffs into the lungs every 6 (six) hours as needed  for wheezing or shortness of breath. 01/08/21   Regalado, Belkys A, MD  allopurinol (ZYLOPRIM) 300 MG tablet Take 1 tablet (300 mg total) by mouth daily. 01/08/21   Regalado, Belkys A, MD  cefdinir (OMNICEF) 300 MG capsule Take 1 capsule (300 mg total) by mouth every 12 (twelve) hours. 01/08/21   Regalado, Belkys A, MD  dextromethorphan-guaiFENesin (MUCINEX DM) 30-600 MG 12hr tablet Take 1 tablet by mouth 2 (two) times daily. 01/08/21   Regalado, Belkys A, MD  ferrous sulfate 325 (65 FE) MG tablet Take 1 mg by mouth daily.    [provider]  latanoprost (XALATAN) 0.005 % ophthalmic solution Place 1 drop into both eyes at bedtime.    [provider]  magnesium gluconate (MAGONATE) 500 MG tablet Take 1 tablet (500 mg total) by mouth daily. 01/08/21   Regalado, Belkys A, MD  midodrine (PROAMATINE) 2.5 MG tablet Take 1 tablet (2.5 mg total) by mouth 3 (three) times daily with meals. 01/08/21   Regalado, Belkys A, MD  tamsulosin (FLOMAX) 0.4 MG CAPS capsule Take 1 capsule (0.4 mg total) by mouth daily. 01/08/21   Regalado, Belkys A, MD  vitamin B-12 (CYANOCOBALAMIN) 1000 MCG tablet Take 1,000 mcg by mouth daily.    [provider]    Allergies Aspirin and Tylenol [acetaminophen]  No family history on file.  Social History Social History   Tobacco Use  . Smoking status: Never  . Smokeless tobacco: Never  Substance Use Topics  . Alcohol use: Not Currently  . Drug use: Never    Review of Systems Level 5 caveat:  history/ROS limited by acute/critical illness  Constitutional: No fever/chills Eyes: No visual changes. ENT: No sore throat. Cardiovascular: Denies chest pain. Respiratory: Positive for shortness of breath, worse with exertion. Gastrointestinal: No abdominal pain.  No nausea, no vomiting.  No diarrhea.  No constipation. Genitourinary: Negative for dysuria. Musculoskeletal: Positive for swelling in his legs.  Negative for neck pain.  Negative for back  pain. Integumentary: Negative for rash. Neurological: Negative for headaches, focal weakness or numbness.   ____________________________________________   PHYSICAL EXAM:  VITAL SIGNS: ED Triage Vitals  Enc Vitals Group     BP 02/04/2021 0226 (!) 85/61     Pulse Rate 01/31/2021 0226 93     Resp 02/17/2021 0226 (!) 22     Temp 02/18/2021 0226 98.4 F (36.9 C)     Temp Source 02/07/2021 0226 Oral     SpO2 02/10/2021 0226 97 %     Weight 01/27/2021 0228 78.9 kg (174 lb)     Height 02/16/2021 0228 1.765 m (5' 9.5")     Head Circumference --      Peak Flow --      Pain Score 02/10/2021 0228 0     Pain Loc --      Pain Edu? --      Excl. in [place]? --     Constitutional: Alert and oriented.  Ill-appearing but nontoxic. Eyes: Conjunctivae are normal.  Head: Atraumatic. Nose: No congestion/rhinnorhea. Mouth/Throat: Patient is wearing a mask. Neck: No stridor.  No meningeal signs.   Cardiovascular: Borderline tachycardia, regular rhythm. Good peripheral circulation. Respiratory: Increased respiratory rate but without accessory muscle usage.  No coarse breath sounds auscultated. Gastrointestinal: Soft and nontender. No distention.  Musculoskeletal: No lower extremity tenderness nor edema. No gross deformities of extremities. Neurologic:  Normal speech and language. No gross focal neurologic deficits are appreciated.  Skin:  Skin is warm, dry and intact.  He has multiple red lesions on his anterior upper chest, some of which appear to be excoriated, but are not consistent with any specific diagnosis and they are nonpainful.  They do not appear consistent with shingles. Psychiatric: Mood and affect are normal. Speech and behavior are normal.  ____________________________________________   LABS (all labs ordered are listed, but only abnormal results are displayed)  Labs Reviewed  LACTIC ACID, PLASMA - Abnormal; Notable for the following components:      Result Value   Lactic Acid, Venous 3.0 (*)     All other components within normal limits  COMPREHENSIVE METABOLIC PANEL - Abnormal; Notable for the following components:   Sodium 123 (*)    Chloride 93 (*)    Glucose, Bld 183 (*)    BUN 24 (*)    Creatinine, Ser 1.27 (*)    Calcium 8.2 (*)    Total Protein 5.2 (*)    Albumin 3.2 (*)    AST 48 (*)    GFR, Estimated 56 (*)    All other components within normal limits  CBC WITH DIFFERENTIAL/PLATELET - Abnormal; Notable for the following components:   WBC 1.6 (*)    RBC 3.73 (*)    Hemoglobin 9.9 (*)    HCT 28.6 (*)    MCV 76.7 (*)    RDW 16.8 (*)    Platelets 67 (*)    Neutro Abs 1.4 (*)    Lymphs Abs 0.0 (*)  All other components within normal limits  PROTIME-INR - Abnormal; Notable for the following components:   Prothrombin Time 15.8 (*)    INR 1.3 (*)    All other components within normal limits  RESP PANEL BY RT-PCR (FLU A&B, COVID) ARPGX2  URINE CULTURE  CULTURE, BLOOD (ROUTINE X 2)  CULTURE, BLOOD (ROUTINE X 2)  APTT  PROCALCITONIN  LACTIC ACID, PLASMA  URINALYSIS, COMPLETE (UACMP) WITH MICROSCOPIC  BRAIN NATRIURETIC PEPTIDE   ____________________________________________  EKG  ED ECG REPORT I, Hinda Kehr, the attending physician, personally viewed and interpreted this ECG.  Date: 02/03/2021 EKG Time: 2:46 AM Rate: 92 Rhythm: normal sinus rhythm QRS Axis: normal Intervals: Right bundle branch block and left anterior fascicular block with short PR interval ST/T Wave abnormalities: Non-specific ST segment / T-wave changes, but no clear evidence of acute ischemia. Narrative Interpretation: no definitive evidence of acute ischemia; does not meet STEMI criteria.  ____________________________________________  RADIOLOGY I, Hinda Kehr, personally viewed and evaluated these images (plain radiographs) as part of my medical decision making, as well as reviewing the written report by the radiologist.  ED MD interpretation: Worsening multifocal pneumonia  than before.  Official radiology report(s): DG Chest Port 1 View  Result Date: 01/30/2021 CLINICAL DATA:  Questionable sepsis EXAM: PORTABLE CHEST 1 VIEW COMPARISON:  01/02/2021 FINDINGS: Extensive bilateral airspace disease throughout both lungs, most confluent in the right upper lobe and diffusely throughout the left lung. This is worsened throughout the left lung since prior study. Findings are concerning for multifocal pneumonia. Right Port-A-Cath remains in place, unchanged. Heart is normal size. No visible effusions or pneumothorax. IMPRESSION: Extensive bilateral airspace disease, worsening on the left since prior study. Findings are concerning for multifocal pneumonia. Electronically Signed   By: Rolm Baptise M.D.   On: 01/27/2021 03:06    ____________________________________________   PROCEDURES   Procedure(s) performed (including Critical Care):  .1-3 Lead EKG Interpretation  Date/Time: 01/28/2021 4:49 AM Performed by: Hinda Kehr, MD Authorized by: Hinda Kehr, MD     Interpretation: normal     ECG rate:  95   ECG rate assessment: normal     Rhythm: sinus rhythm     Ectopy: none     Conduction: normal   .Critical Care  Date/Time: 02/15/2021 4:49 AM Performed by: Hinda Kehr, MD Authorized by: Hinda Kehr, MD   Critical care provider statement:    Critical care time (minutes):  45   Critical care time was exclusive of:  Separately billable procedures and treating other patients   Critical care was necessary to treat or prevent imminent or life-threatening deterioration of the following conditions:  Sepsis   Critical care was time spent personally by me on the following activities:  Development of treatment plan with patient or surrogate, discussions with consultants, evaluation of patient's response to treatment, examination of patient, obtaining history from patient or surrogate, ordering and performing treatments and interventions, ordering and review of  laboratory studies, ordering and review of radiographic studies, pulse oximetry, re-evaluation of patient's condition and review of old charts   ____________________________________________   [place] / MDM / [place] / ED COURSE  As part of my medical decision making, I reviewed the following data within the [place] notes reviewed and incorporated, Labs reviewed , EKG interpreted , Old chart reviewed, Radiograph reviewed , Discussed with admitting physician , and Notes from prior ED visits   Differential diagnosis includes, but is not limited to, recurrent or persistent  pneumonia, PE, other nonspecific infectious process, less likely ACS.  The patient is on the cardiac monitor to evaluate for evidence of arrhythmia and/or significant heart rate changes.  Patient appears uncomfortable and is tachypneic but his lung sounds are generally reassuring.  At 1 point his oxygen saturation was 88% on 2 L with a good waveform but then as he talk to me his oxygen level came out to about 92% on his chronic 2 L.  I am initiating a "possible sepsis" evaluation including a COVID test.  He is hypertensive and I ordered 1 L lactated Ringer's.  We will continue to monitor.     Clinical Course as of 02/11/2021 [id number]  Nancy Fetter Feb 01, 2021  0346 I personally reviewed the patient's imaging and agree with the radiologist's interpretation that there is worsening airspace disease than before consistent with worsening multifocal pneumonia.  The patient's lactic acid is 3.  He meets criteria for sepsis and I initiated code sepsis and added an additional 1.5 L of lactated Ringer's to maybe 30 mill per kilo IV fluid bolus goal since he is already received 1 L of lactated Ringer's.  I ordered cefepime 2 g IV and vancomycin per pharmacy consult. [CF]  0354 CMP is notable for some hyponatremia, otherwise generally unremarkable.  CBC still pending. [CF]  0405 CBC WITH  DIFFERENTIAL(!) Persistent pancytopenia [CF]  0405 SARS Coronavirus 2 by RT PCR: NEGATIVE [CF]  0425 Procalcitonin: 0.45 [CF]  0448 Discussed case by phone with Dr. Damita Dunnings with the hospitalist service and she will admit. [CF]  0518 Of note, the patient has been admitted to the hospital service, but I was informed that his breathing is getting a little bit worse and he is now 88% on 2 L.  He has a little bit more coarse and wheezy than before.  He is most likely suffering from some volume overload as result of the sepsis fluids protocol.  I ordered BiPAP, spoke personally with respiratory therapist, and informed the hospitalist of the change of status. [CF]    Clinical Course User Index [CF] Hinda Kehr, MD     ____________________________________________  FINAL CLINICAL IMPRESSION(S) / ED DIAGNOSES  Final diagnoses:  Severe sepsis ([place])  Multifocal pneumonia  Hyponatremia  Pancytopenia ([place])     MEDICATIONS GIVEN DURING THIS VISIT:  Medications  lactated ringers bolus 1,000 mL (1,000 mLs Intravenous New Bag/Given 01/23/2021 0425)    And  lactated ringers bolus 500 mL (500 mLs Intravenous New Bag/Given 01/24/2021 0426)  ceFEPIme (MAXIPIME) 2 g in sodium chloride 0.9 % 100 mL IVPB (2 g Intravenous New Bag/Given 01/27/2021 0426)  vancomycin (VANCOREADY) IVPB 2000 mg/400 mL (2,000 mg Intravenous New Bag/Given 02/12/2021 0425)  ipratropium-albuterol (DUONEB) 0.5-2.5 (3) MG/3ML nebulizer solution 3 mL (has no administration in time range)  lactated ringers bolus 1,000 mL (0 mLs Intravenous Stopped 02/16/2021 0425)  ipratropium-albuterol (DUONEB) 0.5-2.5 (3) MG/3ML nebulizer solution 3 mL (3 mLs Nebulization Given 01/27/2021 0437)  ipratropium-albuterol (DUONEB) 0.5-2.5 (3) MG/3ML nebulizer solution 3 mL (3 mLs Nebulization Given 02/03/2021 0444)     ED Discharge Orders     None        Note:  This document was prepared using Dragon voice recognition software and may include unintentional  dictation errors.   Hinda Kehr, MD 01/25/2021 [phone]

## 2021-02-01 NOTE — ED Triage Notes (Signed)
Patient reports recently hospital admission for pneumonia a couple weeks ago.  Reports had a follow up MD visit on Friday and since then has been feeling more short of breath than usually.  Wear O2 chronically  at 2 liters.

## 2021-02-01 NOTE — Progress Notes (Signed)
PHARMACY -  BRIEF ANTIBIOTIC NOTE   Pharmacy has received consult(s) for Vanc, Cefepime from an ED provider.  The patient's profile has been reviewed for ht/wt/allergies/indication/available labs.    One time order(s) placed for Vancomycin 2 gm IV X 1 and Cefepime 2 gm IV X 1   Further antibiotics/pharmacy consults should be ordered by admitting physician if indicated.                       Thank you, Anelise Staron D 01/30/2021  4:00 AM

## 2021-02-01 NOTE — ED Notes (Signed)
Urinal bedside, call bell in reach, no other needs at this time

## 2021-02-01 NOTE — Progress Notes (Signed)
Pharmacy Antibiotic Note  Harry Strong is a 85 y.o. male admitted on 02/17/2021 with sepsis.  Pharmacy has been consulted for Vancomycin, Cefepime  dosing.  Plan: Cefepime 2 gm IV X 1 given in ED on 6/12 @ 0426. Cefepime 2 gm IV Q12H ordered to continue on 6/12 @ 1600.  Vancomycin 2 gm IV X 1 given in ED on 6/12 @ 0425. Vancomycin 1250 mg IV Q24H ordered to start on 6/13 @ 0400.   AUC = 537.7 Vanc trough = 14 mcg/mL   Height: 5' 9.5" (176.5 cm) Weight: 78.9 kg (174 lb) IBW/kg (Calculated) : 71.85  Temp (24hrs), Avg:98.4 F (36.9 C), Min:98.3 F (36.8 C), Max:98.4 F (36.9 C)  Recent Labs  Lab 02/17/2021 0255  WBC 1.6*  CREATININE 1.27*  LATICACIDVEN 3.0*    Estimated Creatinine Clearance: 44 mL/min (A) (by C-G formula based on SCr of 1.27 mg/dL (H)).    Allergies  Allergen Reactions   Aspirin Swelling   Tylenol [Acetaminophen] Other (See Comments)    Red face, tongue, and lips    Antimicrobials this admission:   >>    >>   Dose adjustments this admission:   Microbiology results:  BCx:   UCx:    Sputum:    MRSA PCR:   Thank you for allowing pharmacy to be a part of this patient's care.  Jerel Sardina D 01/27/2021 5:35 AM

## 2021-02-01 NOTE — Procedures (Signed)
Endotracheal Intubation: Patient required intubation due to acute respiratory failure with hypoxia.  Consent: Emergent.  Patient did give verbal consent prior to intubation.  Hand washing performed prior to starting the procedure.   Medications administered for sedation prior to procedure:  Midazolam 1 mg IV,  Fentanyl 50 mcg IV, etomidate 20 mg IV, rocuronium 40 mg IV.   A time out procedure was called and correct patient, name, & ID confirmed. Needed supplies and equipment were assembled and checked to include ETT, 10 ml syringe, Glidescope, Mac and Miller blades, suction, oxygen and bag mask valve, end tidal CO2 monitor.   Patient was positioned to align the mouth and pharynx to facilitate visualization of the glottis.   Heart rate, SpO2 and blood pressure was continuously monitored during the procedure. Pre-oxygenation was conducted prior to intubation with ball-valve mask.   Airway visualization was excellent.  Glidescope with #4 blade was utilized for visualization #8 Shiley ET tube was passed through the vocal cords on the first attempt.  ETT was secured at 24 cm mark.  Placement was confirmed by auscuitation of lungs with good breath sounds bilaterally and no epigastric sounds.  Condensation was noted on endotracheal tube.CO2 detector in place with appropriate color change.   Complications: None .   Operator: Patsey Berthold  Chest radiograph showed proper position of the ET tube and no pneumothorax.  Renold Don, MD [facility]   *This note was dictated using voice recognition software/Dragon.  Despite best efforts to proofread, errors can occur which can change the meaning.  Any change was purely unintentional.

## 2021-02-01 NOTE — Progress Notes (Signed)
CODE SEPSIS - PHARMACY COMMUNICATION  **Broad Spectrum Antibiotics should be administered within 1 hour of Sepsis diagnosis**  Time Code Sepsis Called/Page Received: 6/12 @ 0346   Antibiotics Ordered: Vanc, Cefepime   Time of 1st antibiotic administration: Vanc 2 gm IV X 1 on 6/12 @ 0425  Additional action taken by pharmacy:   If necessary, Name of Provider/Nurse Contacted:     Tarren Sabree D ,PharmD Clinical Pharmacist  02/04/2021  4:43 AM

## 2021-02-01 NOTE — ED Notes (Signed)
Pt having mottled skin and more tachypnia, resp on way, sat 81%

## 2021-02-01 NOTE — ED Notes (Signed)
Pt more tachypneic and unable to speak in complete sentences, cannot get oxygen above 88% on breathing treatments, provider aware, resp called to place pt on bipap, pt agrees to same

## 2021-02-01 NOTE — Progress Notes (Signed)
Notified bedside nurse of need to draw repeat lactic acid. 

## 2021-02-02 ENCOUNTER — Encounter: Payer: Self-pay | Admitting: Internal Medicine

## 2021-02-02 ENCOUNTER — Inpatient Hospital Stay: Payer: Medicare Other

## 2021-02-02 DIAGNOSIS — E872 Acidosis, unspecified: Secondary | ICD-10-CM

## 2021-02-02 DIAGNOSIS — Z7189 Other specified counseling: Secondary | ICD-10-CM

## 2021-02-02 DIAGNOSIS — R579 Shock, unspecified: Secondary | ICD-10-CM

## 2021-02-02 DIAGNOSIS — Z515 Encounter for palliative care: Secondary | ICD-10-CM

## 2021-02-02 DIAGNOSIS — J189 Pneumonia, unspecified organism: Secondary | ICD-10-CM | POA: Diagnosis not present

## 2021-02-02 LAB — PROTIME-INR
INR: 1.4 — ABNORMAL HIGH (ref 0.8–1.2)
Prothrombin Time: 16.9 seconds — ABNORMAL HIGH (ref 11.4–15.2)

## 2021-02-02 LAB — CBC WITH DIFFERENTIAL/PLATELET
Abs Immature Granulocytes: 0.04 10*3/uL (ref 0.00–0.07)
Basophils Absolute: 0 10*3/uL (ref 0.0–0.1)
Basophils Relative: 0 %
Eosinophils Absolute: 0 10*3/uL (ref 0.0–0.5)
Eosinophils Relative: 0 %
HCT: 25.9 % — ABNORMAL LOW (ref 39.0–52.0)
Hemoglobin: 8.8 g/dL — ABNORMAL LOW (ref 13.0–17.0)
Immature Granulocytes: 2 %
Lymphocytes Relative: 1 %
Lymphs Abs: 0 10*3/uL — ABNORMAL LOW (ref 0.7–4.0)
MCH: 26.5 pg (ref 26.0–34.0)
MCHC: 34 g/dL (ref 30.0–36.0)
MCV: 78 fL — ABNORMAL LOW (ref 80.0–100.0)
Monocytes Absolute: 0.2 10*3/uL (ref 0.1–1.0)
Monocytes Relative: 6 %
Neutro Abs: 2.2 10*3/uL (ref 1.7–7.7)
Neutrophils Relative %: 91 %
Platelets: 57 10*3/uL — ABNORMAL LOW (ref 150–400)
RBC: 3.32 MIL/uL — ABNORMAL LOW (ref 4.22–5.81)
RDW: 17 % — ABNORMAL HIGH (ref 11.5–15.5)
WBC: 2.4 10*3/uL — ABNORMAL LOW (ref 4.0–10.5)
nRBC: 0 % (ref 0.0–0.2)

## 2021-02-02 LAB — PHOSPHORUS: Phosphorus: 3.8 mg/dL (ref 2.5–4.6)

## 2021-02-02 LAB — COMPREHENSIVE METABOLIC PANEL
ALT: 16 U/L (ref 0–44)
AST: 35 U/L (ref 15–41)
Albumin: 2.2 g/dL — ABNORMAL LOW (ref 3.5–5.0)
Alkaline Phosphatase: 49 U/L (ref 38–126)
Anion gap: 4 — ABNORMAL LOW (ref 5–15)
BUN: 26 mg/dL — ABNORMAL HIGH (ref 8–23)
CO2: 22 mmol/L (ref 22–32)
Calcium: 7.4 mg/dL — ABNORMAL LOW (ref 8.9–10.3)
Chloride: 104 mmol/L (ref 98–111)
Creatinine, Ser: 1.67 mg/dL — ABNORMAL HIGH (ref 0.61–1.24)
GFR, Estimated: 40 mL/min — ABNORMAL LOW (ref >60)
Glucose, Bld: 195 mg/dL — ABNORMAL HIGH (ref 70–99)
Potassium: 4.2 mmol/L (ref 3.5–5.1)
Sodium: 130 mmol/L — ABNORMAL LOW (ref 135–145)
Total Bilirubin: 0.8 mg/dL (ref 0.3–1.2)
Total Protein: 3.9 g/dL — ABNORMAL LOW (ref 6.5–8.1)

## 2021-02-02 LAB — BODY FLUID CELL COUNT WITH DIFFERENTIAL: Total Nucleated Cell Count, Fluid: 5 cu mm (ref 0–1000)

## 2021-02-02 LAB — STREP PNEUMONIAE URINARY ANTIGEN: Strep Pneumo Urinary Antigen: NEGATIVE

## 2021-02-02 LAB — URINE CULTURE

## 2021-02-02 LAB — TRIGLYCERIDES: Triglycerides: 90 mg/dL (ref <150)

## 2021-02-02 LAB — MAGNESIUM: Magnesium: 1.7 mg/dL (ref 1.7–2.4)

## 2021-02-02 LAB — PROCALCITONIN: Procalcitonin: 1.07 ng/mL

## 2021-02-02 MED ORDER — FREE WATER
30.0000 mL | Status: DC
  Administered 2021-02-02 – 2021-02-06 (×22): 30 mL

## 2021-02-02 MED ORDER — PROSOURCE TF PO LIQD
45.0000 mL | Freq: Two times a day (BID) | ORAL | Status: DC
  Administered 2021-02-02 – 2021-02-06 (×7): 45 mL
  Filled 2021-02-02 (×9): qty 45

## 2021-02-02 MED ORDER — VANCOMYCIN HCL 750 MG/150ML IV SOLN
750.0000 mg | INTRAVENOUS | Status: DC
  Filled 2021-02-02: qty 150

## 2021-02-02 MED ORDER — SODIUM CHLORIDE 0.9 % IV SOLN
200.0000 mg | Freq: Once | INTRAVENOUS | Status: AC
  Administered 2021-02-02: 200 mg via INTRAVENOUS
  Filled 2021-02-02: qty 200

## 2021-02-02 MED ORDER — VITAL AF 1.2 CAL PO LIQD
1000.0000 mL | ORAL | Status: DC
  Administered 2021-02-02 – 2021-02-04 (×2): 1000 mL

## 2021-02-02 NOTE — Progress Notes (Signed)
NAME:  Harry Strong, MRN:  [id number], DOB:  Oct 15, 1934, LOS: 1 ADMISSION DATE:  01/23/2021, CONSULTATION DATE: 01/25/2021 REFERRING MD:  Wendee Beavers MD, CHIEF COMPLAINT: Of breath, hypoxia  History of Present Illness:  The patient is an 86 year old lifelong never smoker with a history as noted below who presented to [facility] in the early morning hours due to increasing shortness of breath.  He had been recently hospitalized at [facility] from 13 May through 19 May with presumed sepsis, presumed multifocal pneumonia and acute respiratory failure with hypoxia.  Received antibiotics and was discharged home on oxygen 2 L/min.  The patient presented to the emergency room in the early morning hours of today with persistent symptoms of cough, shortness of breath and increased work of breathing.  The patient also had noted lower extremity edema as well.  ED course was noted as follows: ED course: On arrival, tachypneic at 22-29 with O2 sat initially 97% on 2 L in the ER but following IV fluid bolus dropped to 88% on 2 L.  He was afebrile, BP 85/61 with pulse 93 Blood work significant for pancytopenia with WBC 1600, with lactic acid of 3 hemoglobin 9.1 and platelets 67,000.  Sodium 123, creatinine 1.27 up from 0.97 baseline.  Procalcitonin 0.45.  BNP 92 Imaging: Chest x-Harry with extensive bilateral airspace disease, worsening on the left since prior study.  Findings are concerning for multifocal pneumonia.  Current imaging as well as prior imaging was independently reviewed.  The patient was initially treated with sepsis protocol with IV fluids and IV antibiotics.  He was having increased work of breathing and oxygen desaturations and was subsequently placed on BiPAP.  He was initially admitted by the hospitalist group to go to stepdown on BiPAP.  However upon transfer to the stepdown unit the patient continued to have increased work of breathing and oxygen desaturations despite BiPAP.  At that point PCCM was consulted.   On evaluation at the bedside the patient was exceedingly tachypneic, with severe conversational dyspnea.  He was however alert and oriented but in moderate to severe respiratory distress.  Intubation was explained to the patient.  He reiterated that he wanted to be full code.  Was recently relocated to the area from [place].  Has an extensive oncologic history treated at [facility].  We managed to procure records which are under the care everywhere tab.  The patient is single and has a close friend in the area by the name of [name] whom he share information with.  He does not mention any other relatives.  Further history could not be obtained due to the patient's severity of illness.  02/02/21- patients family Harry Strong has called he is reporting to be POA and he asked some questions regarding care and will speak with other emergency contact [name] for goals of care.  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 rate (!) 24, height 5' 9.5" (1.765 m), weight 77.4 kg, SpO2 99 %.    Vent Mode: PRVC FiO2 (%):  [28 %-65 %] 55 % Set Rate:  [24 bmp] 24 bmp Vt Set:  [480 mL] 480 mL PEEP:  [5 cmH20] 5 cmH20 Plateau Pressure:  [18 cmH20] 18 cmH20   Intake/Output  Summary (Last 24 hours) at 02/02/2021 0836 Last data filed at 02/02/2021 0600 Gross per 24 hour  Intake 3868.69 ml  Output 1550 ml  Net 2318.69 ml   Filed Weights   02/19/2021 0228 02/13/2021 1136  Weight: 78.9 kg 77.4 kg   Examination: GENERAL: Chronically ill-appearing elderly male HEAD: Normocephalic, atraumatic.  EYES: Pupils equal, round, reactive to light.  No scleral icterus.  MOUTH: Oral mucosa moist, dentition intact. NECK: Supple. No thyromegaly. Trachea midline. No JVD.  No adenopathy. PULMONARY: Mechanical ventilation audible respiratory motion CARDIOVASCULAR: Tachycardic with frequent extrasystoles.  Cardiac sounds obscured by pulmonary sounds ABDOMEN: Nondistended, soft, nontender, no hepatomegaly, palpable spleen. MUSCULOSKELETAL: No joint deformity, no clubbing, +1 to +2 edema lower extremities.  NEUROLOGIC: Sedated with GCS 4 T SKIN: Intact,warm,dry.  Poor skin turgor. PSYCH: Sedated on mechanical ventilation  Labs/imaging that I havepersonally reviewed  (right click and "Reselect all SmartList Selections" daily)  Lab data as noted below was reviewed by me.      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, pancytopenia Patient is an immunocompromised host  VAP protocol once on vent Sedation protocol once on vent Status post bronchoscopy studies in process- BAL for cultures, cytology, cell count, pneumocystis PCR-see orders Failure to respond to antibiotics points to opportunistic infection versus malignancy Fungitell, CMV, histo PCR/titers Trend chest x-Harry, ABG as needed Trend procalcitonin, baseline: 0.45 Trend inflammatory markers  Shock, hypovolemic, versus septic History of hypotension Volume resuscitate Pressors as needed to maintain MAP>65 Stress dose steroids Continue  home midodrine  Lactic acidosis Volume resuscitate Ensure adequate perfusion Pressors as needed Trend lactic acid  Refractory Anaplastic Large Cell Lymphoma Pancytopenia, coagulopathy Records from [facility] procured Reviewed records Prognosis guarded Status post oncology evaluation-appreciate input Dr. Grayland Ormond  Hyponatremia Likely related to hypovolemia Spot urine sodium and chloride Volume resuscitate with NS/crystalloid Monitor sodium    Best practice (right click and "Reselect all SmartList Selections" daily)  Diet:  NPO Pain/Anxiety/Delirium protocol (if indicated): Yes (RASS goal -2) VAP protocol (if indicated): Yes DVT prophylaxis: Contraindicated pancytopenia/coagulopathy GI prophylaxis: PPI Glucose control:  SSI No Central venous access:  N/A patient does have port in place (present PTA) Arterial line:  N/A Foley:  Yes, and it is still needed Mobility:  bed rest  PT consulted: N/A Last date of multidisciplinary goals of care discussion [6/12] Code Status:  full code Disposition: ICU  Labs   CBC: Recent Labs  Lab 01/30/2021 0255 02/02/21 0450  WBC 1.6* 2.4*  NEUTROABS 1.4* 2.2  HGB 9.9* 8.8*  HCT 28.6* 25.9*  MCV 76.7* 78.0*  PLT 67* 57*     Basic Metabolic Panel: Recent Labs  Lab 01/25/2021 0255 02/07/2021 0724 02/02/2021 0824 02/02/21 0448  NA 123*  --  125* 130*  K 3.6  --  3.5 4.2  CL 93*  --  96* 104  CO2 23  --  22 22  GLUCOSE 183*  --  183* 195*  BUN 24*  --  22 26*  CREATININE 1.27*  --  1.21 1.67*  CALCIUM 8.2*  --  7.4* 7.4*  MG  --  1.5*  --  1.7  PHOS  --   --  3.3 3.8    GFR: Estimated Creatinine Clearance: 33.5 mL/min (A) (by C-G formula based on SCr of 1.67 mg/dL (H)). Recent Labs  Lab 01/27/2021 0255 02/10/2021 0606 01/31/2021 1804 01/26/2021 2157 02/02/21 0450  PROCALCITON 0.45  --   --   --   --   WBC 1.6*  --   --   --  2.4*  LATICACIDVEN 3.0* 1.5 2.0* 1.5  --      Liver Function Tests: Recent Labs  Lab  01/30/2021 0255 01/30/2021 0824 02/02/21 0448  AST 48*  --  35  ALT 21  --  16  ALKPHOS 70  --  49  BILITOT 1.0  --  0.8  PROT 5.2*  --  3.9*  ALBUMIN 3.2* 2.3* 2.2*    No results for input(s): LIPASE, AMYLASE in the last 168 hours. No results for input(s): AMMONIA in the last 168 hours.  ABG    Component Value Date/Time   PHART 7.32 (L) 02/14/2021 1426   PCO2ART 38 01/25/2021 1426   PO2ART 78 (L) 02/03/2021 1426   HCO3 19.6 (L) 01/27/2021 1426   ACIDBASEDEF 6.0 (H) 02/05/2021 1426   O2SAT 94.3 01/29/2021 1426     Coagulation Profile: Recent Labs  Lab 02/12/2021 0255 02/02/21 0448  INR 1.3* 1.4*     Cardiac Enzymes: No results for input(s): CKTOTAL, CKMB, CKMBINDEX, TROPONINI in the last 168 hours.  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   Allergies Allergies  Allergen Reactions   Aspirin Swelling   Tylenol [Acetaminophen] Other (See Comments)    Red face, tongue, and lips     Home Medications  Prior to Admission medications   Medication Sig Start Date End Date Taking? Authorizing Provider  albuterol  (VENTOLIN HFA) 108 (90 Base) MCG/ACT inhaler Inhale 2 puffs into the lungs every 6 (six) hours as needed for wheezing or shortness of breath. 01/08/21  Yes Regalado, Belkys A, MD  allopurinol (ZYLOPRIM) 300 MG tablet Take 1 tablet (300 mg total) by mouth daily. 01/08/21  Yes Regalado, Belkys A, MD  dextromethorphan-guaiFENesin (MUCINEX DM) 30-600 MG 12hr tablet Take 1 tablet by mouth 2 (two) times daily. Patient taking differently: Take 1 tablet by mouth 2 (two) times daily as needed for cough. 01/08/21  Yes  Regalado, Belkys A, MD  ferrous sulfate 325 (65 FE) MG tablet Take 325 mg by mouth daily.   Yes [provider]  latanoprost (XALATAN) 0.005 % ophthalmic solution Place 1 drop into both eyes at bedtime.   Yes [provider]  magnesium gluconate (MAGONATE) 500 MG tablet Take 1 tablet (500 mg total) by mouth daily. 01/08/21  Yes Regalado, Belkys A, MD  midodrine (PROAMATINE) 2.5 MG tablet Take 1 tablet (2.5 mg total) by mouth 3 (three) times daily with meals. 01/08/21  Yes Regalado, Belkys A, MD  tamsulosin (FLOMAX) 0.4 MG CAPS capsule Take 1 capsule (0.4 mg total) by mouth daily. 01/08/21  Yes Regalado, Belkys A, MD  vitamin B-12 (CYANOCOBALAMIN) 1000 MCG tablet Take 1,000 mcg by mouth daily.   Yes [provider]    Scheduled Meds:  albuterol  2.5 mg Nebulization Q6H   allopurinol  300 mg Per Tube Daily   chlorhexidine gluconate (MEDLINE KIT)  15 mL Mouth Rinse BID   Chlorhexidine Gluconate Cloth  6 each Topical Q0600   docusate  100 mg Per Tube BID   fentaNYL (SUBLIMAZE) injection  50 mcg Intravenous Once   ferrous sulfate  325 mg Oral Daily   hydrocortisone sod succinate (SOLU-CORTEF) inj  100 mg Intravenous Q8H   latanoprost  1 drop Both Eyes QHS   mouth rinse  15 mL Mouth Rinse 10 times per day   midazolam  1 mg Intravenous Once   midodrine  5 mg Per Tube TID WC   pantoprazole (PROTONIX) IV  40 mg Intravenous Q24H   polyethylene glycol  17 g Per Tube Daily    tamsulosin  0.4 mg Oral Daily   vitamin B-12  1,000 mcg Oral Daily   Continuous Infusions:  azithromycin     ceFEPime (MAXIPIME) IV     fentaNYL infusion INTRAVENOUS 275 mcg/hr (02/02/21 0600)   lactated ringers 125 mL/hr at 02/02/21 0645   midazolam 1 mg/hr (02/02/21 0600)   norepinephrine (LEVOPHED) Adult infusion 6 mcg/min (02/02/21 0600)   vancomycin 1,250 mg (02/02/21 0320)   PRN Meds:.acetaminophen **OR** acetaminophen, fentaNYL, midazolam, ondansetron **OR** ondansetron (ZOFRAN) IV, senna-docusate, sodium chloride flush  Critical care time: 108 minutes    The patient is critically ill with multiple organ systems failure and requires high complexity decision making for assessment and support, frequent evaluation and titration of therapies, application of advanced monitoring technologies and extensive interpretation of multiple databases. Critical Care Time devoted to patient care services described in this note is 90 minutes.    *This note was dictated using voice recognition software/Dragon.  Despite best efforts to proofread, errors can occur which can change the meaning.  Any change was purely unintentional.

## 2021-02-02 NOTE — Progress Notes (Signed)
OT Cancellation Note  Patient Details Name: Harry Strong MRN: [id number] DOB: September 19, 1934   Cancelled Treatment:    Reason Eval/Treat Not Completed: Medical issues which prohibited therapy;Patient not medically ready. OT order received and chart reviewed. Pt currently intubated (per chart pt intubated 6/12 d/t acute respiratory failure with hypoxia); d/t this, will discontinue therapy order at this time.  Please re-consult  when pt is medically appropriate to participate in therapeutic intervention.  Darleen Crocker, [place], OTR/L , CBIS ascom [phone]  02/02/21, 8:55 AM    02/02/2021, 8:54 AM

## 2021-02-02 NOTE — Consult Note (Signed)
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  He also has pancytopenia, but is unclear his baseline.  Patient had bronchoscopy yesterday to assess infectious versus recurrence of his disease.  Results are pending at time of dictation.  On previous admission, Granix was held for his neutropenia to avoid confusion if there is actual recurrence.  No family at bedside and review of systems is unobtainable.  REVIEW OF SYSTEMS:   Review of Systems  Reason unable to perform ROS: Patient intubated.    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 No pertinent family history.  ADVANCED DIRECTIVES (Y/N):  '@ADVDIR' @  HEALTH MAINTENANCE: Social History   Tobacco Use   Smoking status: Never   Smokeless tobacco: Never  Substance Use Topics   Alcohol use: Not Currently   Drug use: Never      Colonoscopy:  PAP:  Bone density:  Lipid panel:  Allergies  Allergen Reactions   Aspirin Swelling   Tylenol [Acetaminophen] Other (See Comments)    Red face, tongue, and lips    Current Facility-Administered Medications  Medication Dose Route Frequency Provider Last Rate Last Admin   acetaminophen (TYLENOL) tablet 650 mg  650 mg Oral Q6H PRN Athena Masse, MD       Or   acetaminophen (TYLENOL) suppository 650 mg  650 mg Rectal Q6H PRN Athena Masse, MD       albuterol (PROVENTIL) (2.5 MG/3ML) 0.083% nebulizer solution 2.5 mg  2.5 mg Nebulization Q6H Tyler Pita, MD   2.5 mg at 02/02/21 1345   allopurinol (ZYLOPRIM) tablet 300 mg  300 mg Per Tube Daily Rust-Chester, Britton L, NP   300 mg at 02/02/21 0955   azithromycin (ZITHROMAX) 500 mg in sodium chloride 0.9 % 250 mL IVPB  500 mg Intravenous Q24H Gonfa, Taye T, MD 250 mL/hr at 02/02/21 1100 500 mg at 02/02/21 1100   ceFEPIme (MAXIPIME) 2 g in sodium chloride 0.9 % 100 mL IVPB  2 g Intravenous Q12H Judd Gaudier V, MD 200 mL/hr at 02/02/21 1000 2 g at 02/02/21 1000   chlorhexidine gluconate (MEDLINE KIT) (PERIDEX) 0.12 % solution 15 mL  15 mL Mouth Rinse BID Tyler Pita, MD   15 mL at 02/02/21 0800   Chlorhexidine Gluconate Cloth 2 % PADS 6 each  6 each Topical Q0600 Athena Masse, MD   6 each at 02/02/21 [phone]  docusate (COLACE) 50 MG/5ML liquid 100 mg  100 mg Per Tube BID Rust-Chester, Toribio Harbour L, NP   100 mg at 02/02/21 0951   feeding supplement (PROSource TF) liquid 45 mL  45 mL Per Tube BID Ottie Glazier, MD       feeding supplement (VITAL AF 1.2 CAL) liquid 1,000 mL  1,000 mL Per Tube Continuous Ottie Glazier, MD       fentaNYL (SUBLIMAZE) bolus via infusion 50 mcg  50 mcg Intravenous Q2H PRN Rust-Chester, Toribio Harbour L, NP       fentaNYL (SUBLIMAZE) injection 50 mcg  50 mcg Intravenous Once Tyler Pita, MD       fentaNYL 2543mg in NS 2516m(1029mml) infusion-PREMIX  0-400 mcg/hr Intravenous  Continuous GonTyler PitaD 25 mL/hr at 02/02/21 1014 250 mcg/hr at 02/02/21 1014   ferrous sulfate tablet 325 mg  325 mg Oral Daily GonWendee Beavers MD   325 mg at 02/02/21 0953   free water 30 mL  30 mL Per Tube Q4H Aleskerov, Fuad, MD   30 mL at 02/02/21 1251   hydrocortisone sodium succinate (SOLU-CORTEF) 100 MG injection 100 mg  100 mg Intravenous Q8H GonVernard Gambles MD   100 mg at 02/02/21 0715   latanoprost (XALATAN) 0.005 % ophthalmic solution 1 drop  1 drop Both Eyes QHS GonWendee Beavers MD   1 drop at 02/13/2021 2154   MEDLINE mouth rinse  15 mL Mouth Rinse 10 times per day GonTyler PitaD   15 mL at 02/02/21 1436   midazolam (VERSED) 50 mg/50 mL (1 mg/mL) premix infusion  0.5-10 mg/hr Intravenous Continuous GonTyler PitaD 1 mL/hr at 02/02/21 0600 1 mg/hr at 02/02/21 0600   midazolam (VERSED) bolus via infusion 2 mg  2 mg Intravenous Q2H PRN Rust-Chester, BriHuel CoteP       midazolam (VERSED) injection 1 mg  1 mg Intravenous Once GonTyler PitaD       midodrine (PROAMATINE) tablet 5 mg  5 mg Per Tube TID WC Rust-Chester, Britton L, NP   5 mg at 02/02/21 1252   norepinephrine (LEVOPHED) 4mg57m 250mL54mmix infusion  0-40 mcg/min Intravenous Titrated GonzaTyler Pita26.3 mL/hr at 02/02/21 1255 7.013 mcg/min at 02/02/21 1255   ondansetron (ZOFRAN) tablet 4 mg  4 mg Oral Q6H PRN DuncaAthena Masse      Or   ondansetron (ZOFRAnn Klein Forensic Centerection 4 mg  4 mg Intravenous Q6H PRN DuncaAthena Masse      pantoprazole (PROTONIX) injection 40 mg  40 mg Intravenous Q24H GonzaVernard GamblesD   40 mg at 02/19/2021 1500   polyethylene glycol (MIRALAX / GLYCOLAX) packet 17 g  17 g Per Tube Daily Rust-Chester, Britton L, NP   17 g at 02/02/21 0951   senna-docusate (Senokot-S) tablet 1 tablet  1 tablet Oral QHS PRN DuncaAthena Masse      sodium chloride flush (NS) 0.9 % injection 10-40 mL  10-40 mL Intracatheter PRN GonzaTyler Pita      tamsulosin (FLOMSonora Eye Surgery Ctrsule  0.4 mg  0.4 mg Oral Daily GonfaWendee BeaversD   0.4 mg at 02/02/21 0955   [START ON 02/03/2021] vancomycin (VANCOREADY) IVPB 750 mg/150 mL  750 mg Intravenous Q24H AleskOttie Glazier      vitamin B-12 (CYANOCOBALAMIN) tablet 1,000 mcg  1,000 mcg Oral Daily GonfaMercy Riding  1,000 mcg  at 02/02/21 0956    OBJECTIVE: Vitals:   02/02/21 1100 02/02/21 1533  BP:    Pulse:    Resp:    Temp:    SpO2: 98% 96%     Body mass index is 24.84 kg/m.    ECOG FS:4 - Bedbound  General: Intubated and sedated.   HEENT: ET tube in place. Lungs: Mechanical breath sounds. Heart: Regular rate and rhythm. Abdomen: Soft, nontender, no obvious distention. Musculoskeletal: No edema, cyanosis, or clubbing. Neuro: Patient sedated.  Cranial nerves grossly intact. Skin: No rashes or petechiae noted.  LAB RESULTS:  Lab Results  Component Value Date   NA 130 (L) 02/02/2021   K 4.2 02/02/2021   CL 104 02/02/2021   CO2 22 02/02/2021   GLUCOSE 195 (H) 02/02/2021   BUN 26 (H) 02/02/2021   CREATININE 1.67 (H) 02/02/2021   CALCIUM 7.4 (L) 02/02/2021   PROT 3.9 (L) 02/02/2021   ALBUMIN 2.2 (L) 02/02/2021   AST 35 02/02/2021   ALT 16 02/02/2021   ALKPHOS 49 02/02/2021   BILITOT 0.8 02/02/2021   GFRNONAA 40 (L) 02/02/2021    Lab Results  Component Value Date   WBC 2.4 (L) 02/02/2021   NEUTROABS 2.2 02/02/2021   HGB 8.8 (L) 02/02/2021   HCT 25.9 (L) 02/02/2021   MCV 78.0 (L) 02/02/2021   PLT 57 (L) 02/02/2021     STUDIES: US Abdomen Limited  Result Date: 01/07/2021 CLINICAL DATA:  Abdominal distension EXAM: LIMITED ABDOMEN ULTRASOUND FOR ASCITES TECHNIQUE: Limited ultrasound survey for ascites was performed in all four abdominal quadrants. COMPARISON:  None. FINDINGS: No evident ascites.  Peristalsing bowel noted. Spleen noted to be enlarged measuring 16.1 x 17.0 x 6.6 cm with a measured splenic volume of 945 cubic cm. No splenic lesions evident. No perisplenic fluid or adenopathy. IMPRESSION: No  evident ascites.  Splenomegaly. Electronically Signed   By: Lowella Grip III M.D.   On: 01/07/2021 13:28   Portable Chest xray  Result Date: 02/02/2021 CLINICAL DATA:  Acute respiratory failure EXAM: PORTABLE CHEST 1 VIEW COMPARISON:  Radiograph 01/23/2021 FINDINGS: Accessed right Port-A-Cath tip terminates at the superior cavoatrial junction Endotracheal tube tip terminates 3 cm from the carina. Transesophageal tube tip and side port terminate distal to the GE junction, tip below the margins of imaging. Telemetry leads and support devices overlie the chest. Widespread heterogeneous opacities throughout both lungs demonstrate some interval shifting opacity with clearing towards the right lung apex but increasing density in the lung bases though this may be related to some diminished lung volumes when compared to priors. Trace bilateral effusions. No visible pneumothorax. Cardiomediastinal contours are partially obscured with visible portions not significantly changed from comparison. No acute osseous or chest wall abnormalities. IMPRESSION: Diffuse heterogeneous opacities persist throughout both lungs with some shifting opacity largely attributable to diminishing lung volumes. Support devices as above. Electronically Signed   By: Lovena Le M.D.   On: 02/02/2021 04:10   DG Chest Port 1 View  Result Date: 02/02/2021 CLINICAL DATA:  Intubation EXAM: PORTABLE CHEST 1 VIEW COMPARISON:  February 01, 2021 FINDINGS: The cardiomediastinal silhouette is unchanged in contour.RIGHT chest port with tip terminating over the superior cavoatrial junction. Enteric tube side port projects over the GE junction. ETT tip terminates 3.7 cm above the carina. No pleural effusion. No pneumothorax. Upper lobe predominant bilateral heterogeneous opacities, similar in comparison to prior. Visualized abdomen is unremarkable. Multilevel degenerative changes of the thoracic spine. IMPRESSION: 1. ETT tip terminates 3.7 cm above  the  carina. 2. Enteric tube side port projects at the GE junction. Recommend advancement. Electronically Signed   By: Valentino Saxon MD   On: 01/25/2021 14:23   DG Chest Port 1 View  Result Date: 01/25/2021 CLINICAL DATA:  Questionable sepsis EXAM: PORTABLE CHEST 1 VIEW COMPARISON:  01/02/2021 FINDINGS: Extensive bilateral airspace disease throughout both lungs, most confluent in the right upper lobe and diffusely throughout the left lung. This is worsened throughout the left lung since prior study. Findings are concerning for multifocal pneumonia. Right Port-A-Cath remains in place, unchanged. Heart is normal size. No visible effusions or pneumothorax. IMPRESSION: Extensive bilateral airspace disease, worsening on the left since prior study. Findings are concerning for multifocal pneumonia. Electronically Signed   By: Rolm Baptise M.D.   On: 02/15/2021 03:06   ECHOCARDIOGRAM COMPLETE  Result Date: 01/07/2021    ECHOCARDIOGRAM REPORT   Patient Name:   JANDIEL MAGALLANES Date of Exam: 01/06/2021 Medical Rec #:  [id number]        Height:       69.0 in Accession #:    [id number]       Weight:       180.0 lb Date of Birth:  1935-06-24         BSA:          1.976 m Patient Age:    72 years         BP:           118/62 mmHg Patient Gender: M                HR:           80 bpm. Exam Location:  [facility] Procedure: 2D Echo, Cardiac Doppler and Color Doppler Indications:     R01.1 Murmur  History:         Patient has no prior history of Echocardiogram examinations.  Sonographer:     Wilford Sports Rodgers-Jones Referring Phys:  [id number] HYQMV AMERY Diagnosing Phys: Nelva Bush MD IMPRESSIONS  1. Left ventricular ejection fraction, by estimation, is 60 to 65%. The left ventricle has normal function. The left ventricle has no regional wall motion abnormalities. Left ventricular diastolic parameters are consistent with Grade I diastolic dysfunction (impaired relaxation).  2. Right ventricular systolic function is normal. The right  ventricular size is mildly enlarged. There is moderately elevated pulmonary artery systolic pressure.  3. Right atrial size was mildly dilated.  4. The mitral valve is degenerative. Mild to moderate mitral valve regurgitation. No evidence of mitral stenosis.  5. Tricuspid valve regurgitation is mild to moderate.  6. The aortic valve is tricuspid. There is moderate calcification of the aortic valve. There is severe thickening of the aortic valve. Aortic valve regurgitation is not visualized. Moderate aortic valve stenosis. Aortic valve mean gradient measures 21.0  mmHg.  7. Aortic dilatation noted. There is borderline dilatation of the aortic root, measuring 38 mm. There is mild dilatation of the ascending aorta, measuring 43 mm.  8. The inferior vena cava is normal in size with <50% respiratory variability, suggesting right atrial pressure of 8 mmHg. FINDINGS  Left Ventricle: Left ventricular ejection fraction, by estimation, is 60 to 65%. The left ventricle has normal function. The left ventricle has no regional wall motion abnormalities. The left ventricular internal cavity size was normal in size. There is  no left ventricular hypertrophy. Left ventricular diastolic parameters are consistent with Grade I diastolic dysfunction (impaired relaxation). Right Ventricle: The right ventricular size is mildly  enlarged. No increase in right ventricular wall thickness. Right ventricular systolic function is normal. There is moderately elevated pulmonary artery systolic pressure. The tricuspid regurgitant velocity is 3.08 m/s, and with an assumed right atrial pressure of 8 mmHg, the estimated right ventricular systolic pressure is 33.0 mmHg. Left Atrium: Left atrial size was normal in size. Right Atrium: Right atrial size was mildly dilated. Pericardium: There is no evidence of pericardial effusion. Mitral Valve: The mitral valve is degenerative in appearance. There is mild thickening of the mitral valve leaflet(s). Mild to  moderate mitral annular calcification. Mild to moderate mitral valve regurgitation. No evidence of mitral valve stenosis. Tricuspid Valve: The tricuspid valve is normal in structure. Tricuspid valve regurgitation is mild to moderate. Aortic Valve: The aortic valve is tricuspid. There is moderate calcification of the aortic valve. There is severe thickening of the aortic valve. Aortic valve regurgitation is not visualized. Moderate aortic stenosis is present. Aortic valve mean gradient measures 21.0 mmHg. Aortic valve peak gradient measures 31.6 mmHg. Aortic valve area, by VTI measures 1.57 cm. Pulmonic Valve: The pulmonic valve was not well visualized. Pulmonic valve regurgitation is not visualized. No evidence of pulmonic stenosis. Aorta: Aortic dilatation noted. There is borderline dilatation of the aortic root, measuring 38 mm. There is mild dilatation of the ascending aorta, measuring 43 mm. Pulmonary Artery: The pulmonary artery is not well seen. Venous: The inferior vena cava is normal in size with less than 50% respiratory variability, suggesting right atrial pressure of 8 mmHg. IAS/Shunts: The interatrial septum was not well visualized.  LEFT VENTRICLE PLAX 2D LVIDd:         4.48 cm  Diastology LVIDs:         2.14 cm  LV e' medial:    8.45 cm/s LV PW:         0.97 cm  LV E/e' medial:  12.5 LV IVS:        0.82 cm  LV e' lateral:   8.21 cm/s LVOT diam:     2.20 cm  LV E/e' lateral: 12.9 LV SV:         79 LV SV Index:   40 LVOT Area:     3.80 cm  RIGHT VENTRICLE RV Basal diam:  4.38 cm RV S prime:     18.30 cm/s TAPSE (M-mode): 2.4 cm LEFT ATRIUM             Index       RIGHT ATRIUM           Index LA diam:        4.10 cm 2.08 cm/m  RA Area:     17.70 cm LA Vol (A2C):   82.6 ml 41.81 ml/m RA Volume:   60.30 ml  30.52 ml/m LA Vol (A4C):   38.2 ml 19.34 ml/m LA Biplane Vol: 60.1 ml 30.42 ml/m  AORTIC VALVE AV Area (Vmax):    1.47 cm AV Area (Vmean):   1.37 cm AV Area (VTI):     1.57 cm AV Vmax:            281.00 cm/s AV Vmean:          206.400 cm/s AV VTI:            0.503 m AV Peak Grad:      31.6 mmHg AV Mean Grad:      21.0 mmHg LVOT Vmax:         108.50 cm/s LVOT Vmean:  74.250 cm/s LVOT VTI:          0.208 m LVOT/AV VTI ratio: 0.41  AORTA Ao Root diam: 3.80 cm Ao Asc diam:  4.30 cm MITRAL VALVE                TRICUSPID VALVE MV Area (PHT): 3.48 cm     TR Peak grad:   37.9 mmHg MV Decel Time: 218 msec     TR Vmax:        308.00 cm/s MR Peak grad: 134.6 mmHg MR Vmax:      580.00 cm/s   SHUNTS MV E velocity: 106.00 cm/s  Systemic VTI:  0.21 m MV A velocity: 145.00 cm/s  Systemic Diam: 2.20 cm MV E/A ratio:  0.73 Harrell Gave End MD Electronically signed by Nelva Bush MD Signature Date/Time: 01/07/2021/8:35:45 AM    Final     ASSESSMENT: 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  He last received treatment in [place] with gemcitabine and oxaliplatin.  If patient was found to have recurrence and recovered from his current illness, treatment with this regimen would be difficult given his advanced age and baseline pancytopenia.  However, single agent brentuximab may be an option.  It appears he obtained complete remission in the past with this drug. Will await final pathology results from patient's bronchoscopy.  If negative or inconclusive, can consider a bone marrow biopsy in the future after discharge for definitive diagnosis.  Also would recommend PET scan as an outpatient for staging purposes.  No further intervention is needed from an oncology standpoint.  Will arrange follow-up in the cancer center several weeks after his discharge for evaluation and diagnostic planning if desired. 2.  Neutropenia: This appears to be his baseline.  Patient's white blood cell count in [place] in April 2022 was reported to 2.7.  Continue to hold Granix  at this time unless clinically necessary. 3.  Anemia: Patient's hemoglobin has trended down from 11.1 in April 2022 down to current 8.8.  Monitor 4.  Thrombocytopenia: Patient had a normal platelet count in April 2022.  Unclear if this is related to repeated illnesses and medication or secondary to recurrence.  Patient may need a bone marrow biopsy as an outpatient as above.  Appreciate consult, will follow.   Lloyd Huger, MD   02/02/2021 4:09 PM

## 2021-02-02 NOTE — Progress Notes (Signed)
Pharmacy Antibiotic Note  Harry Strong is a 86 y.o. male admitted on 02/03/2021 with sepsis.  Pharmacy has been consulted for Vancomycin, Cefepime  dosing.  Plan: Change vancomycin to 750 mg IV q24h to start tomorrow at 1000 Goal AUC 400-550. Expected AUC: 424 SCr used: 1.67   Height: 5' 9.5" (176.5 cm) Weight: 77.4 kg (170 lb 10.2 oz) IBW/kg (Calculated) : 71.85  Temp (24hrs), Avg:98.6 F (37 C), Min:98.2 F (36.8 C), Max:99.3 F (37.4 C)  Recent Labs  Lab 02/19/2021 0255 02/19/2021 0606 01/22/2021 0824 02/14/2021 1804 02/09/2021 2157 02/02/21 0448 02/02/21 0450  WBC 1.6*  --   --   --   --   --  2.4*  CREATININE 1.27*  --  1.21  --   --  1.67*  --   LATICACIDVEN 3.0* 1.5  --  2.0* 1.5  --   --      Estimated Creatinine Clearance: 33.5 mL/min (A) (by C-G formula based on SCr of 1.67 mg/dL (H)).    Allergies  Allergen Reactions   Aspirin Swelling   Tylenol [Acetaminophen] Other (See Comments)    Red face, tongue, and lips    Antimicrobials this admission: Cefepime 6/12 >> Azithromycin 6/12 >> Vancomycin 6/12 >>  Dose adjustments this admission: 6/13 Vanc 1250 mg q24h >> 750 mg q24h  Microbiology results: 6/12 BCx: NG 6/12 UCx: multiple species  6/12 BAL: pending 6/12 MRSA PCR: negative  Thank you for allowing pharmacy to be a part of this patient's care.  Tawnya Crook, PharmD 02/02/2021 2:09 PM

## 2021-02-02 NOTE — Progress Notes (Signed)
   02/02/21 0935  Clinical Encounter Type  Visited With Patient  Visit Type Initial;Spiritual support;Social support  Referral From Chaplain  Consult/Referral To [employer] ministered with presence at the door and whispered a prayer. Chaplain will try to follow up by phone.

## 2021-02-02 NOTE — Progress Notes (Signed)
PT Cancellation Note  Patient Details Name: Harry Strong MRN: [id number] DOB: [birth year]   Cancelled Treatment:    Reason Eval/Treat Not Completed: Patient not medically ready.  PT consult received.  Chart reviewed.  Pt currently intubated (per chart pt intubated 6/12 d/t acute respiratory failure with hypoxia); d/t this, will discontinue therapy order at this time.  Please re-consult PT when pt is medically appropriate to participate in PT.  Leitha Bleak, PT 02/02/21, 8:45 AM

## 2021-02-02 NOTE — Consult Note (Addendum)
Consultation Note Date: 02/02/2021   Patient Name: Harry Strong  DOB: Mar 12, 1935  MRN: [id number]  Age / Sex: 86 y.o., male  PCP: Mechele Claude, FNP Referring Physician: Ottie Glazier, MD  Reason for Consultation: Establishing goals of care and Psychosocial/spiritual support  HPI/Patient Profile: 86 y.o. male  with past medical history of B-cell lymphoma, BPH, gout, pancytopenia, chronic hypotension on midodrine, hospitalized 5/13 through 19 with sepsis, multifocal pneumonia and respiratory failure discharged on home O2, admitted on 02/02/2021 with severe sepsis with acute respiratory failure with hypoxia secondary to multifocal pneumonia, intubated 6/12.   Clinical Assessment and Goals of Care: I have reviewed medical records including EPIC notes, labs and imaging, received report from RN, assessed the patient.  Mr. Rubey is lying quietly in bed, intubated/ventilated and sedated.  He is unable to make his basic needs known.  There is no family at bedside at this time.  Call to dear friend, [name], who has known Syler since he was 86 years old. to discuss diagnosis prognosis, GOC, EOL wishes, disposition and options.   I introduced Palliative Medicine as specialized medical care for people living with serious illness. It focuses on providing relief from the symptoms and stress of a serious illness. The goal is to improve quality of life for both the patient and the family.  We discussed a brief life review of the patient.  Mr. Orion Modest recently moved from [place] about 6 months ago.  He had been treated for lymphoma about 10 years ago.  Celibate since 1990's.   We talked about Garen's current illness. The natural disease trajectory and expectations at EOL were discussed.    I attempted to elicit values and goals of care important to the patient.  Lanny Hurst states he has encouraged Rich to e mobility,  but shares that Denice Paradise has not shown initiative for improvement.  Lanny Hurst states repeatedly that he has not gotten healthcare power of attorney for [employer].  He gives the contact information for Coleston's brother and nephew.  Discussed the importance of continued conversation with family and the medical providers regarding overall plan of care and treatment options, ensuring decisions are within the context of the patient's values and GOCs.    Questions and concerns were addressed. The family was encouraged to call with questions or concerns.  PMT will continue to support holistically.  Conference with attending, bedside nursing staff related to patient condition, needs and goals of care.    HCPOA   NEXT OF KIN -longtime friend [name] states that Denice Paradise has a brother, Hernando Reali in [place] gives phone [phone].  Also nephew, John's son, Benz Vandenberghe at [phone].    SUMMARY OF RECOMMENDATIONS   At this point full scope/full code PMT to reach out to family for goals of care/CODE STATUS discussions  Code Status/Advance Care Planning: Full code -need to discuss CODE STATUS with family  Symptom Management:  Per CCM, no additional needs at this time.  Palliative Prophylaxis:  Frequent Pain Assessment, Oral  Care, and Turn Reposition  Additional Recommendations (Limitations, Scope, Preferences): Full Scope Treatment  Psycho-social/Spiritual:  Desire for further Chaplaincy support:yes Additional Recommendations: Caregiving  Support/Resources and ICU Family Guide  Prognosis:  Unable to determine, guarded at this point.  In hospital death would not be surprising based on acuity of illness, recent functional status decline, and advanced age.  Discharge Planning:  To be determined, based on outcomes.       Primary Diagnoses: Present on Admission:  Multifocal pneumonia  Pancytopenia ([place])  Pneumonia   I have reviewed the medical record, interviewed the patient and family,  and examined the patient. The following aspects are pertinent.  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   Refractory has failed multiple regimens   Moderate aortic stenosis    Pancytopenia ([facility])    Pulmonary hypertension ([facility])    Social History   Socioeconomic History   Marital status: Single    Spouse name: Not on file   Number of children: Not on file   Years of education: Not on file   Highest education level: Not on file  Occupational History   Not on file  Tobacco Use   Smoking status: Never   Smokeless tobacco: Never  Substance and Sexual Activity   Alcohol use: Not Currently   Drug use: Never   Sexual activity: Not Currently  Other Topics Concern   Not on file  Social History Narrative   Not on file   Social Determinants of Health   Financial Resource Strain: Not on file  Food Insecurity: Not on file  Transportation Needs: Not on file  Physical Activity: Not on file  Stress: Not on file  Social Connections: Not on file   History reviewed. No pertinent family history. Scheduled Meds:  albuterol  2.5 mg Nebulization Q6H   allopurinol  300 mg Per Tube Daily   chlorhexidine gluconate (MEDLINE KIT)  15 mL Mouth Rinse BID   Chlorhexidine Gluconate Cloth  6 each Topical Q0600   docusate  100 mg Per Tube BID   feeding supplement (PROSource TF)  45 mL Per Tube BID   fentaNYL (SUBLIMAZE) injection  50 mcg Intravenous Once   ferrous sulfate  325 mg Oral Daily   free water  30 mL Per Tube Q4H   hydrocortisone sod succinate (SOLU-CORTEF) inj  100 mg Intravenous Q8H   latanoprost  1 drop Both Eyes QHS   mouth rinse  15 mL Mouth Rinse 10 times per day   midazolam  1 mg Intravenous Once   midodrine  5 mg Per Tube TID WC   pantoprazole (PROTONIX) IV  40 mg Intravenous Q24H   polyethylene glycol  17 g Per Tube Daily   tamsulosin  0.4 mg Oral Daily   vitamin B-12   1,000 mcg Oral Daily   Continuous Infusions:  azithromycin 500 mg (02/02/21 1100)   ceFEPime (MAXIPIME) IV 2 g (02/02/21 1000)   feeding supplement (VITAL AF 1.2 CAL)     fentaNYL infusion INTRAVENOUS 250 mcg/hr (02/02/21 1014)   midazolam 1 mg/hr (02/02/21 0600)   norepinephrine (LEVOPHED) Adult infusion 7.013 mcg/min (02/02/21 1255)   [START ON 02/03/2021] vancomycin     PRN Meds:.acetaminophen **OR** acetaminophen, fentaNYL, midazolam, ondansetron **OR** ondansetron (ZOFRAN) IV, senna-docusate, sodium chloride flush Medications Prior to Admission:  Prior to Admission medications   Medication Sig Start Date End Date Taking? Authorizing Provider  albuterol (  VENTOLIN HFA) 108 (90 Base) MCG/ACT inhaler Inhale 2 puffs into the lungs every 6 (six) hours as needed for wheezing or shortness of breath. 01/08/21  Yes Regalado, Belkys A, MD  allopurinol (ZYLOPRIM) 300 MG tablet Take 1 tablet (300 mg total) by mouth daily. 01/08/21  Yes Regalado, Belkys A, MD  dextromethorphan-guaiFENesin (MUCINEX DM) 30-600 MG 12hr tablet Take 1 tablet by mouth 2 (two) times daily. Patient taking differently: Take 1 tablet by mouth 2 (two) times daily as needed for cough. 01/08/21  Yes Regalado, Belkys A, MD  ferrous sulfate 325 (65 FE) MG tablet Take 325 mg by mouth daily.   Yes [provider]  latanoprost (XALATAN) 0.005 % ophthalmic solution Place 1 drop into both eyes at bedtime.   Yes [provider]  magnesium gluconate (MAGONATE) 500 MG tablet Take 1 tablet (500 mg total) by mouth daily. 01/08/21  Yes Regalado, Belkys A, MD  midodrine (PROAMATINE) 2.5 MG tablet Take 1 tablet (2.5 mg total) by mouth 3 (three) times daily with meals. 01/08/21  Yes Regalado, Belkys A, MD  tamsulosin (FLOMAX) 0.4 MG CAPS capsule Take 1 capsule (0.4 mg total) by mouth daily. 01/08/21  Yes Regalado, Belkys A, MD  vitamin B-12 (CYANOCOBALAMIN) 1000 MCG tablet Take 1,000 mcg by mouth daily.   Yes [provider]    Allergies  Allergen Reactions   Aspirin Swelling   Tylenol [Acetaminophen] Other (See Comments)    Red face, tongue, and lips   Review of Systems  Unable to perform ROS: Intubated   Physical Exam Vitals and nursing note reviewed.  Constitutional:      General: He is not in acute distress.    Appearance: He is ill-appearing.     Interventions: He is intubated.  Cardiovascular:     Rate and Rhythm: Normal rate.  Pulmonary:     Effort: He is intubated.  Abdominal:     Palpations: Abdomen is soft.  Skin:    General: Skin is warm and dry.     Coloration: Skin is pale.    Vital Signs: BP 106/67   Pulse 61   Temp 98.2 F (36.8 C) (Oral)   Resp (!) 24   Ht 5' 9.5" (1.765 m)   Wt 77.4 kg   SpO2 98%   BMI 24.84 kg/m  Pain Scale: CPOT   Pain Score: 0-No pain   SpO2: SpO2: 98 % O2 Device:SpO2: 98 % O2 Flow Rate: .O2 Flow Rate (L/min): 4 L/min  IO: Intake/output summary:  Intake/Output Summary (Last 24 hours) at 02/02/2021 1528 Last data filed at 02/02/2021 1014 Gross per 24 hour  Intake 3408.86 ml  Output 2350 ml  Net 1058.86 ml    LBM:   Baseline Weight: Weight: 78.9 kg Most recent weight: Weight: 77.4 kg     Palliative Assessment/Data:   Flowsheet Rows    Flowsheet Row Most Recent Value  Intake Tab   Referral Department Hospitalist  Unit at Time of Referral ICU  Palliative Care Primary Diagnosis Sepsis/Infectious Disease  Date Notified 01/24/2021  Palliative Care Type New Palliative care  Reason for referral Clarify Goals of Care  Date of Admission 02/18/2021  Date first seen by Palliative Care 02/02/21  # of days Palliative referral response time 1 Day(s)  # of days IP prior to Palliative referral 0  Clinical Assessment   Palliative Performance Scale Score 20%  Pain Max last 24 hours Not able to report  Pain Min Last 24 hours Not able to report  Dyspnea Max Last 24 Hours Not able to report  Dyspnea Min Last 24 hours Not able to report  Psychosocial  & Spiritual Assessment   Palliative Care Outcomes        Time In: 1500 Time Out: 1550 Time Total: 50 minutes  Greater than 50%  of this time was spent counseling and coordinating care related to the above assessment and plan.  Signed by: Drue Novel, NP   Please contact Palliative Medicine Team phone at [phone] for questions and concerns.  For individual provider: See Shea Evans

## 2021-02-02 NOTE — Progress Notes (Signed)
Initial Nutrition Assessment  DOCUMENTATION CODES:   Non-severe (moderate) malnutrition in context of chronic illness  INTERVENTION:   Vital 1.2 @60ml /hr- Initiate at 63ml/hr and increase by 51ml/hr q 8 hours until goal rate is reached.  Pro-Source 94ml BID via tube, provides 40kcal and 11g of protein per serving   Free water flushes 50ml q4 hours to maintain tube patency   Regimen provides 1808k/day, 130g/day protein and 1342ml/day free water.   Pt at high refeed risk; recommend monitor potassium, magnesium and phosphorus labs daily until stable  NUTRITION DIAGNOSIS:   Moderate Malnutrition related to cancer and cancer related treatments as evidenced by moderate fat depletion, moderate muscle depletion.  GOAL:   Patient will meet greater than or equal to 90% of their needs  MONITOR:   Labs, Vent status, Weight trends, TF tolerance, Skin, I & O's  REASON FOR ASSESSMENT:   Malnutrition Screening Tool, Ventilator    ASSESSMENT:   86 y.o. male with medical history significant for B-cell lymphoma, BPH, gout, pancytopenia, chronic hypotension on midodrine, hospitalized from 5/13-5/19 with sepsis, multifocal pneumonia and respiratory failure, discharged on home O2 at 2 L who is admitted with PNA and sepsis.  Pt sedated and ventilated. OGT in place. Plan is to start tube feeds today. Per family report, pt with poor appetite and oral intake pta; family reports that patient has been eating 1/2 can of soup per day for the past week. Per chart, pt is down 9lbs(5%) over the past month; this is significant. Pt is down 17lbs(10%) since October 2021. Pt likely at high refeed risk.   Medications reviewed and include: allopurinol, colace, ferrous sulfate, solu-cortef, protonix, miralax, B12, eraxis, azithromycin, cefepime, levophed, vancomycin   Labs reviewed: Na 130(L), K 4.2 wnl, BUN 26(H), creat 1.67(H), P 3.8 wnl, Mg 1.7 wnl Wbc- 2.4(L), Hgb 8.8(L), Hct 25.9(L)  Patient is currently  intubated on ventilator support MV: 11.1 L/min Temp (24hrs), Avg:98.6 F (37 C), Min:98.2 F (36.8 C), Max:99.3 F (37.4 C)  Propofol: none   MAP- >70mmHg  UOP- 1571ml   NUTRITION - FOCUSED PHYSICAL EXAM:  Flowsheet Row Most Recent Value  Orbital Region Moderate depletion  Upper Arm Region Moderate depletion  Thoracic and Lumbar Region Moderate depletion  Buccal Region Moderate depletion  Temple Region Moderate depletion  Clavicle Bone Region Mild depletion  Clavicle and Acromion Bone Region Mild depletion  Scapular Bone Region Mild depletion  Dorsal Hand Moderate depletion  Patellar Region Moderate depletion  Anterior Thigh Region Mild depletion  Posterior Calf Region Mild depletion  Edema (RD Assessment) None  Hair Reviewed  Eyes Reviewed  Mouth Reviewed  Skin Reviewed  Nails Reviewed      Diet Order:   Diet Order             Diet NPO time specified  Diet effective now                  EDUCATION NEEDS:   No education needs have been identified at this time  Skin:  Skin Assessment: Reviewed RN Assessment  Last BM:  pta  Height:   Ht Readings from Last 1 Encounters:  02/03/2021 5' 9.5" (1.765 m)    Weight:   Wt Readings from Last 1 Encounters:  02/10/2021 77.4 kg    Ideal Body Weight:  75.45 kg  BMI:  Body mass index is 24.84 kg/m.  Estimated Nutritional Needs:   Kcal:  1795kcal/day  Protein:  115-130g/day  Fluid:  2.0-2.3L/day  Koleen Distance MS,  RD, LDN Please refer to [facility] for RD and/or RD on-call/weekend/after hours pager

## 2021-02-03 DIAGNOSIS — C8470 Anaplastic large cell lymphoma, ALK-negative, unspecified site: Secondary | ICD-10-CM

## 2021-02-03 DIAGNOSIS — E440 Moderate protein-calorie malnutrition: Secondary | ICD-10-CM | POA: Insufficient documentation

## 2021-02-03 LAB — HIV-1/HIV-2 QUAL RNA
HIV-1 RNA, Qualitative: NONREACTIVE
HIV-2 RNA, Qualitative: NONREACTIVE

## 2021-02-03 LAB — CBC
HCT: 27 % — ABNORMAL LOW (ref 39.0–52.0)
Hemoglobin: 9.4 g/dL — ABNORMAL LOW (ref 13.0–17.0)
MCH: 26.7 pg (ref 26.0–34.0)
MCHC: 34.8 g/dL (ref 30.0–36.0)
MCV: 76.7 fL — ABNORMAL LOW (ref 80.0–100.0)
Platelets: 65 10*3/uL — ABNORMAL LOW (ref 150–400)
RBC: 3.52 MIL/uL — ABNORMAL LOW (ref 4.22–5.81)
RDW: 17.3 % — ABNORMAL HIGH (ref 11.5–15.5)
WBC: 4.1 10*3/uL (ref 4.0–10.5)
nRBC: 0 % (ref 0.0–0.2)

## 2021-02-03 LAB — PHOSPHORUS: Phosphorus: 3.8 mg/dL (ref 2.5–4.6)

## 2021-02-03 LAB — BASIC METABOLIC PANEL
Anion gap: 7 (ref 5–15)
BUN: 39 mg/dL — ABNORMAL HIGH (ref 8–23)
CO2: 20 mmol/L — ABNORMAL LOW (ref 22–32)
Calcium: 7.8 mg/dL — ABNORMAL LOW (ref 8.9–10.3)
Chloride: 101 mmol/L (ref 98–111)
Creatinine, Ser: 2.28 mg/dL — ABNORMAL HIGH (ref 0.61–1.24)
GFR, Estimated: 28 mL/min — ABNORMAL LOW (ref >60)
Glucose, Bld: 292 mg/dL — ABNORMAL HIGH (ref 70–99)
Potassium: 3.9 mmol/L (ref 3.5–5.1)
Sodium: 128 mmol/L — ABNORMAL LOW (ref 135–145)

## 2021-02-03 LAB — VANCOMYCIN, RANDOM: Vancomycin Rm: 18

## 2021-02-03 LAB — LEGIONELLA PNEUMOPHILA SEROGP 1 UR AG: L. pneumophila Serogp 1 Ur Ag: NEGATIVE

## 2021-02-03 LAB — PROCALCITONIN: Procalcitonin: 2.21 ng/mL

## 2021-02-03 LAB — ACID FAST SMEAR (AFB, MYCOBACTERIA): Acid Fast Smear: NEGATIVE

## 2021-02-03 LAB — MAGNESIUM: Magnesium: 1.8 mg/dL (ref 1.7–2.4)

## 2021-02-03 MED ORDER — VANCOMYCIN VARIABLE DOSE PER UNSTABLE RENAL FUNCTION (PHARMACIST DOSING): Status: DC

## 2021-02-03 MED ORDER — SODIUM CHLORIDE 0.9 % IV SOLN
2.0000 g | INTRAVENOUS | Status: DC
  Filled 2021-02-03: qty 2

## 2021-02-03 MED ORDER — LACTATED RINGERS IV BOLUS
1000.0000 mL | Freq: Once | INTRAVENOUS | Status: AC
  Administered 2021-02-03: 1000 mL via INTRAVENOUS

## 2021-02-03 MED ORDER — SODIUM CHLORIDE 0.9 % IV SOLN
3.0000 g | Freq: Two times a day (BID) | INTRAVENOUS | Status: DC
  Administered 2021-02-04: 3 g via INTRAVENOUS
  Filled 2021-02-03 (×3): qty 8

## 2021-02-03 NOTE — Progress Notes (Signed)
NAME:  Harry Strong, MRN:  [id number], DOB:  November 29, 1934, LOS: 2 ADMISSION DATE:  01/28/2021, CONSULTATION DATE: 01/26/2021 REFERRING MD:  Wendee Beavers MD, CHIEF COMPLAINT: Of breath, hypoxia  History of Present Illness:  The patient is an 86 year old lifelong never smoker with a history as noted below who presented to [facility] in the early morning hours due to increasing shortness of breath.  He had been recently hospitalized at [facility] from 13 May through 19 May with presumed sepsis, presumed multifocal pneumonia and acute respiratory failure with hypoxia.  Received antibiotics and was discharged home on oxygen 2 L/min.  The patient presented to the emergency room in the early morning hours of today with persistent symptoms of cough, shortness of breath and increased work of breathing.  The patient also had noted lower extremity edema as well.  ED course was noted as follows: ED course: On arrival, tachypneic at 22-29 with O2 sat initially 97% on 2 L in the ER but following IV fluid bolus dropped to 88% on 2 L.  He was afebrile, BP 85/61 with pulse 93 Blood work significant for pancytopenia with WBC 1600, with lactic acid of 3 hemoglobin 9.1 and platelets 67,000.  Sodium 123, creatinine 1.27 up from 0.97 baseline.  Procalcitonin 0.45.  BNP 92 Imaging: Chest x-ray with extensive bilateral airspace disease, worsening on the left since prior study.  Findings are concerning for multifocal pneumonia.  Current imaging as well as prior imaging was independently reviewed.  The patient was initially treated with sepsis protocol with IV fluids and IV antibiotics.  He was having increased work of breathing and oxygen desaturations and was subsequently placed on BiPAP.  He was initially admitted by the hospitalist group to go to stepdown on BiPAP.  However upon transfer to the stepdown unit the patient continued to have increased work of breathing and oxygen desaturations despite BiPAP.  At that point PCCM was consulted.   On evaluation at the bedside the patient was exceedingly tachypneic, with severe conversational dyspnea.  He was however alert and oriented but in moderate to severe respiratory distress.  Intubation was explained to the patient.  He reiterated that he wanted to be full code.  Was recently relocated to the area from [place].  Has an extensive oncologic history treated at [facility]  We managed to procure records which are under the care everywhere tab.  The patient is single and has a close friend in the area by the name of [name] whom he share information with.  He does not mention any other relatives.  Further history could not be obtained due to the patient's severity of illness.  02/02/21- patients family Ray Tel has called he is reporting to be POA and he asked some questions regarding care and will speak with other emergency contact [name] for goals of care.  Patient remains sedated on mechanical ventilation with a focal pneumonia  02/03/21- patient remains on MV. Goals of care is in progress.  OGT feeds continued.   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 rate (!) 24, height 5' 9.49" (1.765 m), weight 81.3 kg, SpO2 97 %.    Vent Mode: PRVC FiO2 (%):  [40 %-50 %] 40  % Set Rate:  [24 bmp] 24 bmp Vt Set:  [480 mL] 480 mL PEEP:  [5 cmH20] 5 cmH20 Plateau Pressure:  [18 cmH20-22 cmH20] 18 cmH20   Intake/Output Summary (Last 24 hours) at 02/03/2021 1652 Last data filed at 02/03/2021 1600 Gross per 24 hour  Intake 2531.8 ml  Output 545 ml  Net 1986.8 ml    Filed Weights   02/12/2021 0228 01/22/2021 1136 02/03/21 0457  Weight: 78.9 kg 77.4 kg 81.3 kg   Examination: GENERAL: Chronically ill-appearing elderly male HEAD: Normocephalic, atraumatic.  EYES: Pupils equal, round, reactive to light.  No scleral icterus.  MOUTH: Oral mucosa moist, dentition intact. NECK: Supple. No thyromegaly. Trachea midline. No JVD.  No adenopathy. PULMONARY: Mechanical ventilation audible respiratory motion CARDIOVASCULAR: Tachycardic with frequent extrasystoles.  Cardiac sounds obscured by pulmonary sounds ABDOMEN: Nondistended, soft, nontender, no hepatomegaly, palpable spleen. MUSCULOSKELETAL: No joint deformity, no clubbing, +1 to +2 edema lower extremities.  NEUROLOGIC: Sedated with GCS 4 T SKIN: Intact,warm,dry.  Poor skin turgor. PSYCH: Sedated on mechanical ventilation  Labs/imaging that I havepersonally reviewed  (right click and "Reselect all SmartList Selections" daily)  Lab data as noted below was reviewed by me.      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, pancytopenia Patient is an immunocompromised host  VAP protocol once on vent Sedation protocol once on vent Status post bronchoscopy studies in process- BAL for cultures, cytology, cell count, pneumocystis PCR-see orders Failure to respond to antibiotics points to opportunistic infection versus malignancy Fungitell, CMV, histo PCR/titers Trend chest x-ray, ABG as needed Trend procalcitonin, baseline: 0.45 Trend  inflammatory markers  Shock, hypovolemic, versus septic History of hypotension Volume resuscitate Pressors as needed to maintain MAP>65 Stress dose steroids Continue home midodrine  Lactic acidosis Volume resuscitate Ensure adequate perfusion Pressors as needed Trend lactic acid  Refractory Anaplastic Large Cell Lymphoma Pancytopenia, coagulopathy Records from [facility] procured Reviewed records Prognosis guarded Status post oncology evaluation-appreciate input Dr. Grayland Ormond  Hyponatremia Likely related to hypovolemia Spot urine sodium and chloride Volume resuscitate with NS/crystalloid Monitor sodium    Best practice (right click and "Reselect all SmartList Selections" daily)  Diet:  NPO Pain/Anxiety/Delirium protocol (if indicated): Yes (RASS goal -2) VAP protocol (if indicated): Yes DVT prophylaxis: Contraindicated pancytopenia/coagulopathy GI prophylaxis: PPI Glucose control:  SSI No Central venous access:  N/A patient does have port in place (present PTA) Arterial line:  N/A Foley:  Yes, and it is still needed Mobility:  bed rest  PT consulted: N/A Last date of multidisciplinary goals of care discussion [6/12] Code Status:  full code Disposition: ICU  Labs   CBC: Recent Labs  Lab 02/15/2021 0255 02/02/21 0450 02/03/21 0445  WBC 1.6* 2.4* 4.1  NEUTROABS 1.4* 2.2  --   HGB 9.9* 8.8* 9.4*  HCT 28.6* 25.9* 27.0*  MCV 76.7* 78.0* 76.7*  PLT 67* 57* 65*     Basic Metabolic Panel: Recent Labs  Lab 02/04/2021 0255  02/04/2021 0724 02/05/2021 0824 02/02/21 0448 02/03/21 0445  NA 123*  --  125* 130* 128*  K 3.6  --  3.5 4.2 3.9  CL 93*  --  96* 104 101  CO2 23  --  22 22 20*  GLUCOSE 183*  --  183* 195* 292*  BUN 24*  --  22 26* 39*  CREATININE 1.27*  --  1.21 1.67* 2.28*  CALCIUM 8.2*  --  7.4* 7.4* 7.8*  MG  --  1.5*  --  1.7 1.8  PHOS  --   --  3.3 3.8 3.8    GFR: Estimated Creatinine Clearance: 24.5 mL/min (A) (by C-G formula based  on SCr of 2.28 mg/dL (H)). Recent Labs  Lab 02/07/2021 0255 02/19/2021 0606 02/19/2021 1804 02/07/2021 2157 02/02/21 0448 02/02/21 0450 02/03/21 0445  PROCALCITON 0.45  --   --   --  1.07  --  2.21  WBC 1.6*  --   --   --   --  2.4* 4.1  LATICACIDVEN 3.0* 1.5 2.0* 1.5  --   --   --      Liver Function Tests: Recent Labs  Lab 01/26/2021 0255 02/04/2021 0824 02/02/21 0448  AST 48*  --  35  ALT 21  --  16  ALKPHOS 70  --  49  BILITOT 1.0  --  0.8  PROT 5.2*  --  3.9*  ALBUMIN 3.2* 2.3* 2.2*    No results for input(s): LIPASE, AMYLASE in the last 168 hours. No results for input(s): AMMONIA in the last 168 hours.  ABG    Component Value Date/Time   PHART 7.32 (L) 02/07/2021 1426   PCO2ART 38 02/05/2021 1426   PO2ART 78 (L) 01/23/2021 1426   HCO3 19.6 (L) 01/28/2021 1426   ACIDBASEDEF 6.0 (H) 02/09/2021 1426   O2SAT 94.3 01/21/2021 1426     Coagulation Profile: Recent Labs  Lab 02/19/2021 0255 02/02/21 0448  INR 1.3* 1.4*     Cardiac Enzymes: No results for input(s): CKTOTAL, CKMB, CKMBINDEX, TROPONINI in the last 168 hours.  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)    Surgical History:   Past Surgical History:  Procedure Laterality Date   PORTACATH PLACEMENT Right    TRANSURETHRAL RESECTION OF PROSTATE N/A    2017     Social History:   Social History   Tobacco Use   Smoking status: Never   Smokeless tobacco: Never  Substance Use Topics   Alcohol use: Not Currently    Family History:  His family history is not on file.   Allergies Allergies  Allergen Reactions   Aspirin Swelling   Tylenol [Acetaminophen] Other (See Comments)    Red face, tongue, and lips     Home Medications  Prior to Admission medications   Medication Sig Start Date End Date Taking? Authorizing Provider  albuterol (VENTOLIN HFA) 108 (90 Base) MCG/ACT inhaler Inhale 2 puffs into the lungs every 6 (six) hours as  needed for wheezing or shortness of breath. 01/08/21  Yes Regalado, Belkys A, MD  allopurinol (ZYLOPRIM) 300 MG tablet Take 1 tablet (300 mg total) by mouth daily. 01/08/21  Yes Regalado, Belkys A, MD  dextromethorphan-guaiFENesin (MUCINEX DM) 30-600 MG 12hr tablet Take 1 tablet by mouth 2 (two) times daily. Patient taking differently: Take 1 tablet by mouth 2 (two) times daily as needed for cough. 01/08/21  Yes Regalado, Belkys A, MD  ferrous sulfate 325 (65 FE) MG tablet Take 325 mg by mouth daily.   Yes [provider]  latanoprost (XALATAN) 0.005 % ophthalmic solution Place 1 drop into both eyes at bedtime.   Yes [provider]  magnesium gluconate (MAGONATE) 500 MG tablet Take 1 tablet (500 mg total) by mouth daily. 01/08/21  Yes Regalado, Belkys A, MD  midodrine (PROAMATINE) 2.5 MG tablet Take 1 tablet (2.5 mg total) by mouth 3 (three) times daily with meals. 01/08/21  Yes Regalado, Belkys A, MD  tamsulosin (FLOMAX) 0.4 MG CAPS capsule Take 1 capsule (0.4 mg total) by mouth daily. 01/08/21  Yes Regalado, Belkys A, MD  vitamin B-12 (CYANOCOBALAMIN) 1000 MCG tablet Take 1,000 mcg by mouth daily.   Yes [provider]    Scheduled Meds:  albuterol  2.5 mg Nebulization Q6H   allopurinol  300 mg Per Tube Daily   chlorhexidine gluconate (MEDLINE KIT)  15 mL Mouth Rinse BID   Chlorhexidine Gluconate Cloth  6 each Topical Q0600   docusate  100 mg Per Tube BID   feeding supplement (PROSource TF)  45 mL Per Tube BID   ferrous sulfate  325 mg Oral Daily    free water  30 mL Per Tube Q4H   hydrocortisone sod succinate (SOLU-CORTEF) inj  100 mg Intravenous Q8H   latanoprost  1 drop Both Eyes QHS   mouth rinse  15 mL Mouth Rinse 10 times per day   midodrine  5 mg Per Tube TID WC   pantoprazole (PROTONIX) IV  40 mg Intravenous Q24H   polyethylene glycol  17 g Per Tube Daily   tamsulosin  0.4 mg Oral Daily   vancomycin variable dose per unstable renal function (pharmacist dosing)   Does not apply See admin instructions   vitamin B-12  1,000 mcg Oral Daily   Continuous Infusions:  azithromycin Stopped (02/03/21 1141)   ceFEPime (MAXIPIME) IV     feeding supplement (VITAL AF 1.2 CAL) 60 mL/hr at 02/03/21 0430   fentaNYL infusion INTRAVENOUS 125 mcg/hr (02/03/21 1600)   midazolam 1 mg/hr (02/03/21 1600)   norepinephrine (LEVOPHED) Adult infusion 4 mcg/min (02/03/21 1600)   PRN Meds:.acetaminophen **OR** acetaminophen, fentaNYL, midazolam, ondansetron **OR** ondansetron (ZOFRAN) IV, senna-docusate, sodium chloride flush  Critical care time: 33 minutes    The patient is critically ill with multiple organ systems failure and requires high complexity decision making for assessment and support, frequent evaluation and titration of therapies, application of advanced monitoring technologies and extensive interpretation of multiple databases. Critical Care Time devoted to patient care services described in this note is 90 minutes.    *This note was dictated using voice recognition software/Dragon.  Despite best efforts to proofread, errors can occur which can change the meaning.  Any change was purely unintentional.

## 2021-02-03 NOTE — Progress Notes (Signed)
Palliative: Mr. Fredi, Ge[place], is lying quietly in bed.  He is intubated/ventilated and sedated.  There is no family at bedside at this time.  Conference with bedside nursing staff related to patient condition and needs.  Call to brother, Cayson Kalb at [phone].  Jenny Reichmann shares that he is Denice Paradise is only living sibling.  He tells me that he is agreeable to be healthcare surrogate.  John and his wife are in contact with Lanny Hurst in [place].  We talk in detail about respiratory failure, ventilator support, bronchoscopy with cultures and cytology, further lab testing. We talked about CODE STATUS, treat the treatable but allowing natural passing.  At this point, Jenny Reichmann endorses DNR for Denice Paradise.  We talk about future choices.  We talked about meaningful improvements, 14 days on a ventilator which would mean need for tracheostomy.  At this point, Jenny Reichmann states that he does not believe that a tracheostomy would be a benefit for [facility].  Conference with attending, bedside nursing staff, transition of care team related to patient condition, needs, goals of care. PMT to continue to follow.  Plan: At this point continue to treat the treatable but no CPR or intubation.  Time for outcomes.  26 minutes  Quinn Axe, NP Palliative medicine team Team time [phone] Greater than 50% of this time was spent counseling and coordinating care related to the above assessment and plan.

## 2021-02-03 NOTE — Consult Note (Signed)
NAME: Harry Strong  DOB: 1935/01/31  MRN: [id number]  Date/Time: 02/03/2021 2:42 PM  REQUESTING PROVIDER: Lanney Gins Subjective:  REASON FOR CONSULT: sepsis ?No history available from patient as he is intubated Harry Strong is a 86 y.o. with a history of relapsed anaplastic large cell lymphoma, , not on any treatment since 2018 ,pancytopenia IDA, BPH, pancytopenia and AIHA  Recent hospitalization between 5/13-5/19 for SOB and treated as multifocal pneumonia presents from home on 02/16/2021 with increasing SOB- he was c/o worsening sob, had been on oxygen for a month   In the ED vitals BP 85/61, HR 93, RR 22 and temp 98.4. labs NA 123, cr 1.27, lactate 3, wbc 1.6, HB 9.9, PLT 67. CXR b/l infiltrate . Blood cultures sent, was placed on BIPAP and started on IV cefepime/vancomycin. He was admitted to ICU and intubated on 6/12- Bronch was done and sent for tests  pt used to live in [place] and was getting care at [facility] for his malignancy. Was diagnosed in July 2016 . He received DICE+ Brentximab. Marland Kitchen  PET scan 06/02/16 showed multiple bilateral hypermetabolic pulmonarynodules measuring up to 1.8 cm within the left lower lobe. Subcentimeterpulmonary nodules may be below the resolution of PET. Given theconcomitant liver findings, the pulmonary nodules are also suspiciousfor lymphomatous involvement/metastases. 08/17/16 PET had some improvement Got Brentuximab # 9 on 11/03/16. 11/24/16 PET showed resolution of most of pulmonary nodules  04/13/17 # 16 held because of grade 4 neutropenia Chemo on hold because of profound pancytopenia and delayed recovery Last PET CT 11/01/18-Minor tree-in-bud/nodular opacities in the right lung with faintuptake are new from prior, nonspecific but likely inflammatory Last saw his oncologist on 12/17/20. H/o VRE UTI treated many times  Past Medical History:  Diagnosis Date   AIHA (autoimmune hemolytic anemia) ([place]) 2017   Anaplastic large cell lymphoma, ALK negative ([place]) 2017    Refractory has failed multiple regimens   Moderate aortic stenosis    Pancytopenia (HCC)    Pulmonary hypertension (HCC)     Past Surgical History:  Procedure Laterality Date   PORTACATH PLACEMENT Right    TRANSURETHRAL RESECTION OF PROSTATE N/A    2017    Social History   Socioeconomic History   Marital status: Single    Spouse name: Not on file   Number of children: Not on file   Years of education: Not on file   Highest education level: Not on file  Occupational History   Not on file  Tobacco Use   Smoking status: Never   Smokeless tobacco: Never  Substance and Sexual Activity   Alcohol use: Not Currently   Drug use: Never   Sexual activity: Not Currently  Other Topics Concern   Not on file  Social History Narrative   Not on file   Social Determinants of Health   Financial Resource Strain: Not on file  Food Insecurity: Not on file  Transportation Needs: Not on file  Physical Activity: Not on file  Stress: Not on file  Social Connections: Not on file  Intimate Partner Violence: Not on file    History reviewed. No pertinent family history. Allergies  Allergen Reactions   Aspirin Swelling   Tylenol [Acetaminophen] Other (See Comments)    Red face, tongue, and lips   I? Current Facility-Administered Medications  Medication Dose Route Frequency Provider Last Rate Last Admin   acetaminophen (TYLENOL) tablet 650 mg  650 mg Oral Q6H PRN Athena Masse, MD       Or  acetaminophen (TYLENOL) suppository 650 mg  650 mg Rectal Q6H PRN Athena Masse, MD       albuterol (PROVENTIL) (2.5 MG/3ML) 0.083% nebulizer solution 2.5 mg  2.5 mg Nebulization Q6H Tyler Pita, MD   2.5 mg at 02/03/21 1415   allopurinol (ZYLOPRIM) tablet 300 mg  300 mg Per Tube Daily Rust-Chester, Britton L, NP   300 mg at 02/03/21 0922   azithromycin (ZITHROMAX) 500 mg in sodium chloride 0.9 % 250 mL IVPB  500 mg Intravenous Q24H Wendee Beavers T, MD   Stopped at 02/03/21 1141   ceFEPIme  (MAXIPIME) 2 g in sodium chloride 0.9 % 100 mL IVPB  2 g Intravenous Q24H Benita Gutter, RPH       chlorhexidine gluconate (MEDLINE KIT) (PERIDEX) 0.12 % solution 15 mL  15 mL Mouth Rinse BID Tyler Pita, MD   15 mL at 02/03/21 0807   Chlorhexidine Gluconate Cloth 2 % PADS 6 each  6 each Topical Q0600 Athena Masse, MD   6 each at 02/03/21 0554   docusate (COLACE) 50 MG/5ML liquid 100 mg  100 mg Per Tube BID Rust-Chester, Britton L, NP   100 mg at 02/03/21 0921   feeding supplement (PROSource TF) liquid 45 mL  45 mL Per Tube BID Ottie Glazier, MD   45 mL at 02/03/21 0922   feeding supplement (VITAL AF 1.2 CAL) liquid 1,000 mL  1,000 mL Per Tube Continuous Ottie Glazier, MD 60 mL/hr at 02/03/21 0430 Rate Change at 02/03/21 0430   fentaNYL (SUBLIMAZE) bolus via infusion 50 mcg  50 mcg Intravenous Q2H PRN Rust-Chester, Britton L, NP       fentaNYL 2571mg in NS 2550m(1086mml) infusion-PREMIX  0-400 mcg/hr Intravenous Continuous GonTyler PitaD 12.5 mL/hr at 02/03/21 1223 125 mcg/hr at 02/03/21 1223   ferrous sulfate tablet 325 mg  325 mg Oral Daily GonWendee Beavers MD   325 mg at 02/03/21 0926195free water 30 mL  30 mL Per Tube Q4H Aleskerov, Fuad, MD   30 mL at 02/03/21 1223   hydrocortisone sodium succinate (SOLU-CORTEF) 100 MG injection 100 mg  100 mg Intravenous Q8H GonVernard Gambles MD   100 mg at 02/03/21 0621   latanoprost (XALATAN) 0.005 % ophthalmic solution 1 drop  1 drop Both Eyes QHS GonWendee Beavers MD   1 drop at 02/02/21 2143   MEDLINE mouth rinse  15 mL Mouth Rinse 10 times per day GonTyler PitaD   15 mL at 02/03/21 1223   midazolam (VERSED) 50 mg/50 mL (1 mg/mL) premix infusion  0.5-10 mg/hr Intravenous Continuous GonTyler PitaD 1 mL/hr at 02/03/21 1223 1 mg/hr at 02/03/21 1223   midazolam (VERSED) bolus via infusion 2 mg  2 mg Intravenous Q2H PRN Rust-Chester, BriHuel CoteP       midodrine (PROAMATINE) tablet 5 mg  5 mg Per Tube TID WC  Rust-Chester, Britton L, NP   5 mg at 02/03/21 1223   norepinephrine (LEVOPHED) 4mg53m 250mL69mmix infusion  0-40 mcg/min Intravenous Titrated GonzaTyler Pita15 mL/hr at 02/03/21 1223 4 mcg/min at 02/03/21 1223   ondansetron (ZOFRAN) tablet 4 mg  4 mg Oral Q6H PRN DuncaAthena Masse      Or   ondansetron (ZOFRAmbulatory Surgical Center LLCection 4 mg  4 mg Intravenous Q6H PRN DuncaAthena Masse      pantoprazole (PROTONIX) injection 40 mg  40 mg  Intravenous Q24H Tyler Pita, MD   40 mg at 02/02/21 1506   polyethylene glycol (MIRALAX / GLYCOLAX) packet 17 g  17 g Per Tube Daily Rust-Chester, Britton L, NP   17 g at 02/03/21 0354   senna-docusate (Senokot-S) tablet 1 tablet  1 tablet Oral QHS PRN Athena Masse, MD       sodium chloride flush (NS) 0.9 % injection 10-40 mL  10-40 mL Intracatheter PRN Tyler Pita, MD       tamsulosin [facility]) capsule 0.4 mg  0.4 mg Oral Daily Cyndia Skeeters, Taye T, MD   0.4 mg at 02/02/21 0955   vancomycin variable dose per unstable renal function (pharmacist dosing)   Does not apply See admin instructions Benita Gutter, RPH       vitamin B-12 (CYANOCOBALAMIN) tablet 1,000 mcg  1,000 mcg Oral Daily Wendee Beavers T, MD   1,000 mcg at 02/03/21 6568     Abtx:  Anti-infectives (From admission, onward)    Start     Dose/Rate Route Frequency Ordered Stop   02/03/21 2200  ceFEPIme (MAXIPIME) 2 g in sodium chloride 0.9 % 100 mL IVPB        2 g 200 mL/hr over 30 Minutes Intravenous Every 24 hours 02/03/21 0727     02/03/21 1000  vancomycin (VANCOREADY) IVPB 750 mg/150 mL  Status:  Discontinued        750 mg 150 mL/hr over 60 Minutes Intravenous Every 24 hours 02/02/21 1407 02/03/21 0726   02/03/21 0727  vancomycin variable dose per unstable renal function (pharmacist dosing)         Does not apply See admin instructions 02/03/21 0728     02/02/21 1200  anidulafungin (ERAXIS) 200 mg in sodium chloride 0.9 % 200 mL IVPB        200 mg 78 mL/hr over 200 Minutes Intravenous   Once 02/02/21 1046 02/02/21 1520   02/02/21 0400  vancomycin (VANCOREADY) IVPB 1250 mg/250 mL  Status:  Discontinued        1,250 mg 166.7 mL/hr over 90 Minutes Intravenous Every 24 hours 02/19/2021 0534 02/02/21 1407   02/03/2021 1600  ceFEPIme (MAXIPIME) 2 g in sodium chloride 0.9 % 100 mL IVPB  Status:  Discontinued        2 g 200 mL/hr over 30 Minutes Intravenous Every 12 hours 02/15/2021 0529 02/03/21 0727   02/12/2021 1100  azithromycin (ZITHROMAX) 500 mg in sodium chloride 0.9 % 250 mL IVPB        500 mg 250 mL/hr over 60 Minutes Intravenous Every 24 hours 01/30/2021 1053 February 20, 2021 1059   01/29/2021 0515  vancomycin (VANCOCIN) IVPB 1000 mg/200 mL premix  Status:  Discontinued        1,000 mg 200 mL/hr over 60 Minutes Intravenous  Once 01/29/2021 0514 02/10/2021 0532   01/23/2021 0515  ceFEPIme (MAXIPIME) 2 g in sodium chloride 0.9 % 100 mL IVPB  Status:  Discontinued        2 g 200 mL/hr over 30 Minutes Intravenous  Once 01/28/2021 0514 01/31/2021 0532   02/12/2021 0400  vancomycin (VANCOCIN) IVPB 1000 mg/200 mL premix  Status:  Discontinued        1,000 mg 200 mL/hr over 60 Minutes Intravenous  Once 02/03/2021 0346 02/12/2021 0359   02/08/2021 0400  ceFEPIme (MAXIPIME) 2 g in sodium chloride 0.9 % 100 mL IVPB        2 g 200 mL/hr over 30 Minutes Intravenous  Once 01/21/2021 0346  02/14/2021 0456   01/21/2021 0400  vancomycin (VANCOREADY) IVPB 2000 mg/400 mL        2,000 mg 200 mL/hr over 120 Minutes Intravenous  Once 02/19/2021 0359 01/30/2021 0626       REVIEW OF SYSTEMS:  NA Objective:  VITALS:  BP (!) 90/57   Pulse 79   Temp 98.1 F (36.7 C) (Axillary)   Resp (!) 6   Ht 5' 9.49" (1.765 m)   Wt 81.3 kg   SpO2 98%   BMI 26.10 kg/m  PHYSICAL EXAM:  Intubated and sedated Chest b/l crepts Port on the rt sid eof chest Hss1s Abd soft Skin- erythematous lesions with central yellow region scattered over chest   CNS cannot be assessed  Pertinent Labs Lab Results CBC    Component Value Date/Time    WBC 4.1 02/03/2021 0445   RBC 3.52 (L) 02/03/2021 0445   HGB 9.4 (L) 02/03/2021 0445   HCT 27.0 (L) 02/03/2021 0445   PLT 65 (L) 02/03/2021 0445   MCV 76.7 (L) 02/03/2021 0445   MCH 26.7 02/03/2021 0445   MCHC 34.8 02/03/2021 0445   RDW 17.3 (H) 02/03/2021 0445   LYMPHSABS 0.0 (L) 02/02/2021 0450   MONOABS 0.2 02/02/2021 0450   EOSABS 0.0 02/02/2021 0450   BASOSABS 0.0 02/02/2021 0450    CMP Latest Ref Rng & Units 02/03/2021 02/02/2021 01/30/2021  Glucose 70 - 99 mg/dL 292(H) 195(H) 183(H)  BUN 8 - 23 mg/dL 39(H) 26(H) 22  Creatinine 0.61 - 1.24 mg/dL 2.28(H) 1.67(H) 1.21  Sodium 135 - 145 mmol/L 128(L) 130(L) 125(L)  Potassium 3.5 - 5.1 mmol/L 3.9 4.2 3.5  Chloride 98 - 111 mmol/L 101 104 96(L)  CO2 22 - 32 mmol/L 20(L) 22 22  Calcium 8.9 - 10.3 mg/dL 7.8(L) 7.4(L) 7.4(L)  Total Protein 6.5 - 8.1 g/dL - 3.9(L) -  Total Bilirubin 0.3 - 1.2 mg/dL - 0.8 -  Alkaline Phos 38 - 126 U/L - 49 -  AST 15 - 41 U/L - 35 -  ALT 0 - 44 U/L - 16 -      Microbiology: Recent Results (from the past 240 hour(s))  MRSA PCR Screening     Status: None   Collection Time: 02/08/2021  2:13 AM   Specimen: Nasal Mucosa; Nasopharyngeal  Result Value Ref Range Status   MRSA by PCR NEGATIVE NEGATIVE Final    Comment:        The GeneXpert MRSA Assay (FDA approved for NASAL specimens only), is one component of a comprehensive MRSA colonization surveillance program. It is not intended to diagnose MRSA infection nor to guide or monitor treatment for MRSA infections. Performed at [facility], [address] [place], [place] [address]   Blood Culture (routine x 2)     Status: None (Preliminary result)   Collection Time: 02/07/2021  2:55 AM   Specimen: BLOOD  Result Value Ref Range Status   Specimen Description BLOOD RIGHT ANTECUBITAL  Final   Special Requests   Final    BOTTLES DRAWN AEROBIC AND ANAEROBIC Blood Culture adequate volume   Culture   Final    NO GROWTH 2 DAYS Performed at  [facility], [address] [place], [place] [address]    Report Status PENDING  Incomplete  Blood Culture (routine x 2)     Status: None (Preliminary result)   Collection Time: 01/27/2021  2:55 AM   Specimen: BLOOD  Result Value Ref Range Status   Specimen Description BLOOD  RIGHT HAND  Final   Special Requests   Final    BOTTLES DRAWN AEROBIC AND ANAEROBIC Blood Culture results may not be optimal due to an inadequate volume of blood received in culture bottles   Culture   Final    NO GROWTH 2 DAYS Performed at [facility], [address], [place], [place] [address]    Report Status PENDING  Incomplete  Resp Panel by RT-PCR (Flu A&B, Covid) Nasopharyngeal Swab     Status: None   Collection Time: 02/19/2021  2:55 AM   Specimen: Nasopharyngeal Swab; Nasopharyngeal(NP) swabs in vial transport medium  Result Value Ref Range Status   SARS Coronavirus 2 by RT PCR NEGATIVE NEGATIVE Final    Comment: (NOTE) SARS-CoV-2 target nucleic acids are NOT DETECTED.  The SARS-CoV-2 RNA is generally detectable in upper respiratory specimens during the acute phase of infection. The lowest concentration of SARS-CoV-2 viral copies this assay can detect is 138 copies/mL. A negative result does not preclude SARS-Cov-2 infection and should not be used as the sole basis for treatment or other patient management decisions. A negative result may occur with  improper specimen collection/handling, submission of specimen other than nasopharyngeal swab, presence of viral mutation(s) within the areas targeted by this assay, and inadequate number of viral copies(<138 copies/mL). A negative result must be combined with clinical observations, patient history, and epidemiological information. The expected result is Negative.  Fact Sheet for Patients:  EntrepreneurPulse.com.au  Fact Sheet for Healthcare Providers:  IncredibleEmployment.be  This test is no t  yet approved or cleared by the [place] FDA and  has been authorized for detection and/or diagnosis of SARS-CoV-2 by FDA under an Emergency Use Authorization (EUA). This EUA will remain  in effect (meaning this test can be used) for the duration of the COVID-19 declaration under Section 564(b)(1) of the Act, 21 U.S.C.section 360bbb-3(b)(1), unless the authorization is terminated  or revoked sooner.       Influenza A by PCR NEGATIVE NEGATIVE Final   Influenza B by PCR NEGATIVE NEGATIVE Final    Comment: (NOTE) The Xpert Xpress SARS-CoV-2/FLU/RSV plus assay is intended as an aid in the diagnosis of influenza from Nasopharyngeal swab specimens and should not be used as a sole basis for treatment. Nasal washings and aspirates are unacceptable for Xpert Xpress SARS-CoV-2/FLU/RSV testing.  Fact Sheet for Patients: EntrepreneurPulse.com.au  Fact Sheet for Healthcare Providers: IncredibleEmployment.be  This test is not yet approved or cleared by the [place] FDA and has been authorized for detection and/or diagnosis of SARS-CoV-2 by FDA under an Emergency Use Authorization (EUA). This EUA will remain in effect (meaning this test can be used) for the duration of the COVID-19 declaration under Section 564(b)(1) of the Act, 21 U.S.C. section 360bbb-3(b)(1), unless the authorization is terminated or revoked.  Performed at [facility], [address], [place], [place] [address]   Urine culture     Status: Abnormal   Collection Time: 02/03/2021  4:40 AM   Specimen: In/Out Cath Urine  Result Value Ref Range Status   Specimen Description   Final    IN/OUT CATH URINE Performed at [facility], [address] [place], [place] [address]    Special Requests   Final    NONE Performed at [facility], [address] [place], [place] [address]    Culture MULTIPLE SPECIES PRESENT, SUGGEST RECOLLECTION (A)  Final    Report Status 02/02/2021 FINAL  Final  Culture, BAL-quantitative w Gram Stain  Status: Abnormal (Preliminary result)   Collection Time: 02/12/2021  2:27 PM   Specimen: Bronchoalveolar Lavage; Respiratory  Result Value Ref Range Status   Specimen Description   Final    BRONCHIAL ALVEOLAR LAVAGE Performed at [facility], [address] [place], [place] [address]    Special Requests   Final    Immunocompromised Performed at [facility], [place], [place] [address]    Gram Stain NO WBC SEEN NO ORGANISMS SEEN   Final   Culture (A)  Final    >=100,000 COLONIES/mL ENTEROCOCCUS FAECALIS SUSCEPTIBILITIES TO FOLLOW CULTURE REINCUBATED FOR BETTER GROWTH Performed at [facility], [address], [place], [place] [address]    Report Status PENDING  Incomplete  Culture, fungus without smear     Status: None (Preliminary result)   Collection Time: 02/15/2021  2:27 PM   Specimen: Bronchoalveolar Lavage; Other  Result Value Ref Range Status   Specimen Description   Final    BRONCHIAL ALVEOLAR LAVAGE Performed at [facility], [address], [place], [place] [address]    Special Requests   Final    Immunocompromised Performed at [facility], [address], [place], [place] [address]    Culture   Final    NO FUNGUS ISOLATED AFTER 1 DAY Performed at [facility], [place] [address] [place], [place] [address]    Report Status PENDING  Incomplete  Acid Fast Smear (AFB)     Status: None   Collection Time: 02/12/2021  2:27 PM   Specimen: Bronchoalveolar Lavage  Result Value Ref Range Status   AFB Specimen Processing Concentration  Final   Acid Fast Smear Negative  Final    Comment: (NOTE) Performed At: [facility] [place], [place] [id number] Rush Farmer MD QI:[id number]    Source (AFB) BRONCHIAL ALVEOLAR LAVAGE  Final    Comment: Performed at [facility], [address]  [place],  [address]    IMAGING RESULTS:  BILATERAL patchy pulmonary infiltrates, predominantly perivascular, consistent with multifocal pneumonia.  I have personally reviewed the films ? Impression/Recommendation ? Acute hypoxic respiratory failure due to b/l nodular infiltrates- s/p intubaton  B/l nodular infiltrates- pt had these in 2018 and they improved after Anticd30 antineoplastic agent. 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

## 2021-02-03 NOTE — Progress Notes (Signed)
Pharmacy Antibiotic Note  Harry Strong is a 86 y.o. male with medical history including anaplastic large cell lymphoma, gout, pancytopenia, chronic hypotension, recent hospitalization with pneumonia admitted on 02/14/2021 with pneumonia.  Pharmacy has been consulted for vancomycin and cefepime dosing. Patient is also ordered azithromycin.  Today, 02/03/21  --Day # 3 of antibiotics --Scr trending up (1.67 >> 2.28) --BAL growing E faecalis, sensitivities pending --ID to be consulted  Plan:  Adjust cefepime to 2 g IV q24h  Adjust vancomycin to dosing per levels given unstable renal function. Check a level today  Continue to follow-up cultures   Height: 5' 9.49" (176.5 cm) Weight: 81.3 kg (179 lb 3.7 oz) IBW/kg (Calculated) : 71.82  Temp (24hrs), Avg:99.5 F (37.5 C), Min:98.7 F (37.1 C), Max:100.3 F (37.9 C)  Recent Labs  Lab 02/14/2021 0255 02/12/2021 0606 02/11/2021 0824 02/05/2021 1804 01/31/2021 2157 02/02/21 0448 02/02/21 0450 02/03/21 0445  WBC 1.6*  --   --   --   --   --  2.4* 4.1  CREATININE 1.27*  --  1.21  --   --  1.67*  --  2.28*  LATICACIDVEN 3.0* 1.5  --  2.0* 1.5  --   --   --      Estimated Creatinine Clearance: 24.5 mL/min (A) (by C-G formula based on SCr of 2.28 mg/dL (H)).    Allergies  Allergen Reactions   Aspirin Swelling   Tylenol [Acetaminophen] Other (See Comments)    Red face, tongue, and lips    Antimicrobials this admission: Cefepime 6/12 >> Azithromycin 6/12 >> Vancomycin 6/12 >>  Dose adjustments this admission: 6/13 Vanc 1250 mg q24h >> 750 mg q24h 6/14 Cefepime 2 g IV q12h >> q24h, change vancomycin dosing to per levels  Microbiology results: 6/12 BCx: NG 6/12 UCx: multiple species  6/12 BAL: E faecalis 6/12 MRSA PCR: negative  Thank you for allowing pharmacy to be a part of this patient's care.  Benita Gutter 02/03/2021 12:13 PM

## 2021-02-04 DIAGNOSIS — D61818 Other pancytopenia: Secondary | ICD-10-CM

## 2021-02-04 LAB — COMP PANEL: LEUKEMIA/LYMPHOMA

## 2021-02-04 LAB — BASIC METABOLIC PANEL
Anion gap: 11 (ref 5–15)
Anion gap: 11 (ref 5–15)
BUN: 61 mg/dL — ABNORMAL HIGH (ref 8–23)
BUN: 64 mg/dL — ABNORMAL HIGH (ref 8–23)
CO2: 19 mmol/L — ABNORMAL LOW (ref 22–32)
CO2: 18 mmol/L — ABNORMAL LOW (ref 22–32)
Calcium: 7.8 mg/dL — ABNORMAL LOW (ref 8.9–10.3)
Calcium: 8 mg/dL — ABNORMAL LOW (ref 8.9–10.3)
Chloride: 100 mmol/L (ref 98–111)
Chloride: 99 mmol/L (ref 98–111)
Creatinine, Ser: 3.34 mg/dL — ABNORMAL HIGH (ref 0.61–1.24)
Creatinine, Ser: 3.68 mg/dL — ABNORMAL HIGH (ref 0.61–1.24)
GFR, Estimated: 17 mL/min — ABNORMAL LOW (ref >60)
GFR, Estimated: 16 mL/min — ABNORMAL LOW (ref >60)
Glucose, Bld: 215 mg/dL — ABNORMAL HIGH (ref 70–99)
Glucose, Bld: 211 mg/dL — ABNORMAL HIGH (ref 70–99)
Potassium: 4.2 mmol/L (ref 3.5–5.1)
Potassium: 4.2 mmol/L (ref 3.5–5.1)
Sodium: 130 mmol/L — ABNORMAL LOW (ref 135–145)
Sodium: 128 mmol/L — ABNORMAL LOW (ref 135–145)

## 2021-02-04 LAB — CBC
HCT: 26 % — ABNORMAL LOW (ref 39.0–52.0)
Hemoglobin: 8.7 g/dL — ABNORMAL LOW (ref 13.0–17.0)
MCH: 26.4 pg (ref 26.0–34.0)
MCHC: 33.5 g/dL (ref 30.0–36.0)
MCV: 79 fL — ABNORMAL LOW (ref 80.0–100.0)
Platelets: 73 10*3/uL — ABNORMAL LOW (ref 150–400)
RBC: 3.29 MIL/uL — ABNORMAL LOW (ref 4.22–5.81)
RDW: 17.6 % — ABNORMAL HIGH (ref 11.5–15.5)
WBC: 5.7 10*3/uL (ref 4.0–10.5)
nRBC: 0 % (ref 0.0–0.2)

## 2021-02-04 LAB — PROCALCITONIN: Procalcitonin: 1.9 ng/mL

## 2021-02-04 LAB — PHOSPHORUS
Phosphorus: 5.2 mg/dL — ABNORMAL HIGH (ref 2.5–4.6)
Phosphorus: 5.7 mg/dL — ABNORMAL HIGH (ref 2.5–4.6)

## 2021-02-04 LAB — MAGNESIUM
Magnesium: 2 mg/dL (ref 1.7–2.4)
Magnesium: 2 mg/dL (ref 1.7–2.4)

## 2021-02-04 LAB — GLUCOSE, CAPILLARY
Glucose-Capillary: 174 mg/dL — ABNORMAL HIGH (ref 70–99)
Glucose-Capillary: 202 mg/dL — ABNORMAL HIGH (ref 70–99)

## 2021-02-04 MED ORDER — DEXMEDETOMIDINE HCL IN NACL 400 MCG/100ML IV SOLN
0.4000 ug/kg/h | INTRAVENOUS | Status: DC
  Administered 2021-02-04: 0.4 ug/kg/h via INTRAVENOUS
  Administered 2021-02-04 – 2021-02-05 (×3): 0.7 ug/kg/h via INTRAVENOUS
  Administered 2021-02-05 – 2021-02-06 (×3): 0.8 ug/kg/h via INTRAVENOUS
  Administered 2021-02-06: 0.802 ug/kg/h via INTRAVENOUS
  Filled 2021-02-04 (×8): qty 100

## 2021-02-04 MED ORDER — SODIUM CHLORIDE 0.9 % IV SOLN
3.0000 g | Freq: Two times a day (BID) | INTRAVENOUS | Status: DC
  Administered 2021-02-04 – 2021-02-06 (×4): 3 g via INTRAVENOUS
  Filled 2021-02-04 (×2): qty 3
  Filled 2021-02-04 (×3): qty 8
  Filled 2021-02-04: qty 3

## 2021-02-04 MED ORDER — DIGOXIN 0.25 MG/ML IJ SOLN
500.0000 ug | Freq: Once | INTRAMUSCULAR | Status: AC
  Administered 2021-02-04: 500 ug via INTRAVENOUS
  Filled 2021-02-04: qty 2

## 2021-02-04 MED ORDER — LINEZOLID 600 MG PO TABS
600.0000 mg | ORAL_TABLET | Freq: Two times a day (BID) | ORAL | Status: DC
  Filled 2021-02-04 (×2): qty 1

## 2021-02-04 MED ORDER — LACTATED RINGERS IV BOLUS
1000.0000 mL | Freq: Once | INTRAVENOUS | Status: AC
  Administered 2021-02-04: 1000 mL via INTRAVENOUS

## 2021-02-04 NOTE — Progress Notes (Signed)
Date of Admission:  02/10/2021   T   ID: Harry Strong is a 86 y.o. male  Principal Problem:   Multifocal pneumonia Active Problems:   Pancytopenia ([place])   Hyponatremia   Acute respiratory failure with hypoxia (HCC)   Anaplastic large cell lymphoma, ALK negative (HCC)   Shock (HCC)   Lactic acidosis   Malnutrition of moderate degree    Remans intubated Needing less oxygen as per intensivist  Medications:   chlorhexidine gluconate (MEDLINE KIT)  15 mL Mouth Rinse BID   Chlorhexidine Gluconate Cloth  6 each Topical Q0600   docusate  100 mg Per Tube BID   feeding supplement (PROSource TF)  45 mL Per Tube BID   ferrous sulfate  325 mg Oral Daily   free water  30 mL Per Tube Q4H   hydrocortisone sod succinate (SOLU-CORTEF) inj  100 mg Intravenous Q8H   latanoprost  1 drop Both Eyes QHS   linezolid  600 mg Oral Q12H   mouth rinse  15 mL Mouth Rinse 10 times per day   midodrine  5 mg Per Tube TID WC   pantoprazole (PROTONIX) IV  40 mg Intravenous Q24H   polyethylene glycol  17 g Per Tube Daily   tamsulosin  0.4 mg Oral Daily    Objective: Vital signs in last 24 hours: Temp:  [97.8 F (36.6 C)-98.1 F (36.7 C)] 97.8 F (36.6 C) (06/15 0400) Pulse Rate:  [57-111] 92 (06/15 0900) Resp:  [0-26] 24 (06/15 0800) BP: (79-131)/(48-92) 79/48 (06/15 0900) SpO2:  [93 %-99 %] 94 % (06/15 0900) FiO2 (%):  [35 %-40 %] 35 % (06/15 0824) Weight:  [85.3 kg] 85.3 kg (06/15 0427)  PHYSICAL EXAM:  General: Intubated and sedated Lungs: b/l crepts Heart: irrgeular Abdomen: Soft, non-tender,not distended. Bowel sounds normal. No masses Extremities: atraumatic, no cyanosis. No edema. No clubbing Skin:lesions over chest wall  Lymph: Cervical, supraclavicular normal. Neurologic: cannot assess  Lab Results Recent Labs    02/03/21 0445 02/04/21 0440 02/04/21 0935  WBC 4.1 5.7  --   HGB 9.4* 8.7*  --   HCT 27.0* 26.0*  --   NA 128* 130* 128*  K 3.9 4.2 4.2  CL 101 100 99   CO2 20* 19* 18*  BUN 39* 61* 64*  CREATININE 2.28* 3.34* 3.68*   Liver Panel Recent Labs    02/02/21 0448  PROT 3.9*  ALBUMIN 2.2*  AST 35  ALT 16  ALKPHOS 49  BILITOT 0.8   Sedimentation Rate No results for input(s): ESRSEDRATE in the last 72 hours. C-Reactive Protein No results for input(s): CRP in the last 72 hours.  Microbiology:  Studies/Results: No results found.   Assessment/Plan:  Relapsing anaplastic large cell lymphoma  CLL   Acute hypoxic respiratory failure due to bilateral nodular infiltrates in the lung status post intubation Bilateral nodular infiltrates.  Patient had these in 2018 and improved after anti-CD 30 treatment.  This is very likely malignancy Differential diagnosis opportunistic infection He has Enterococcus in the BAL culture this could be possible aspiration.  But no WBC or bacteria on gram stain so this could be colonization as well.  Will treat with Unasyn --has been started on Unasyn since yesterday. BAL test results are pending.  Opportunistic infection tests like CMV, PJP, viral are all pending  Erythematous lesions on the skin over the chest.  This looks like Sweet syndrome.  This could also be neoplastic infiltrate.  Its been mention in the chart that  he has had it for a few months.  A biopsy may be helpful. Pancytopenia.  WBC is stable but he has thrombocytopenia this is related to his primary malignancy.  H/o AIHA  AKI worsening.  Discussed with care team

## 2021-02-04 NOTE — Progress Notes (Signed)
NAME:  Harry Strong, MRN:  [id number], DOB:  Aug 29, 1934, LOS: 3 ADMISSION DATE:  02/07/2021, CONSULTATION DATE: 02/09/2021 REFERRING MD:  Wendee Beavers MD, CHIEF COMPLAINT: Of breath, hypoxia  History of Present Illness:  The patient is an 86 year old lifelong never smoker with a history as noted below who presented to [facility] in the early morning hours due to increasing shortness of breath.  He had been recently hospitalized at [facility] from 13 May through 19 May with presumed sepsis, presumed multifocal pneumonia and acute respiratory failure with hypoxia.  Received antibiotics and was discharged home on oxygen 2 L/min.  The patient presented to the emergency room in the early morning hours of today with persistent symptoms of cough, shortness of breath and increased work of breathing.  The patient also had noted lower extremity edema as well.  ED course was noted as follows: ED course: On arrival, tachypneic at 22-29 with O2 sat initially 97% on 2 L in the ER but following IV fluid bolus dropped to 88% on 2 L.  He was afebrile, BP 85/61 with pulse 93 Blood work significant for pancytopenia with WBC 1600, with lactic acid of 3 hemoglobin 9.1 and platelets 67,000.  Sodium 123, creatinine 1.27 up from 0.97 baseline.  Procalcitonin 0.45.  BNP 92 Imaging: Chest x-ray with extensive bilateral airspace disease, worsening on the left since prior study.  Findings are concerning for multifocal pneumonia.  Current imaging as well as prior imaging was independently reviewed.  The patient was initially treated with sepsis protocol with IV fluids and IV antibiotics.  He was having increased work of breathing and oxygen desaturations and was subsequently placed on BiPAP.  He was initially admitted by the hospitalist group to go to stepdown on BiPAP.  However upon transfer to the stepdown unit the patient continued to have increased work of breathing and oxygen desaturations despite BiPAP.  At that point PCCM was consulted.   On evaluation at the bedside the patient was exceedingly tachypneic, with severe conversational dyspnea.  He was however alert and oriented but in moderate to severe respiratory distress.  Intubation was explained to the patient.  He reiterated that he wanted to be full code.  Was recently relocated to the area from [place].  Has an extensive oncologic history treated at [facility].  We managed to procure records which are under the care everywhere tab.  The patient is single and has a close friend in the area by the name of [name] whom he share information with.  He does not mention any other relatives.  Further history could not be obtained due to the patient's severity of illness.  02/02/21- patients family Ray Tel has called he is reporting to be POA and he asked some questions regarding care and will speak with other emergency contact [name] for goals of care.  Patient remains sedated on mechanical ventilation with a focal pneumonia  02/03/21- patient remains on MV. Goals of care is in progress.  OGT feeds continued.  02/04/21- PRVC weaned to 35%, will perform SBT, patient has had episodes of Atrial fibrillation on telemetry.    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 rate (!) 24, height 5' 9.49" (1.765 m), weight  85.3 kg, SpO2 97 %.    Vent Mode: PRVC FiO2 (%):  [40 %] 40 % Set Rate:  [24 bmp] 24 bmp Vt Set:  [480 mL] 480 mL PEEP:  [5 cmH20] 5 cmH20 Plateau Pressure:  [18 cmH20] 18 cmH20   Intake/Output Summary (Last 24 hours) at 02/04/2021 0831 Last data filed at 02/04/2021 0600 Gross per 24 hour  Intake 2536.97 ml  Output 225 ml  Net 2311.97 ml    Filed Weights   02/18/2021 1136 02/03/21 0457 02/04/21 0427  Weight: 77.4 kg 81.3 kg 85.3 kg   Examination: GENERAL: Chronically ill-appearing elderly male HEAD: Normocephalic, atraumatic.  EYES: Pupils equal, round, reactive to light.  No scleral icterus.  MOUTH: Oral mucosa moist, dentition intact. NECK: Supple. No thyromegaly. Trachea midline. No JVD.  No adenopathy. PULMONARY: Mechanical ventilation audible respiratory motion CARDIOVASCULAR: Tachycardic with frequent extrasystoles.  Cardiac sounds obscured by pulmonary sounds ABDOMEN: Nondistended, soft, nontender, no hepatomegaly, palpable spleen. MUSCULOSKELETAL: No joint deformity, no clubbing, +1 to +2 edema lower extremities.  NEUROLOGIC: Sedated with GCS 4 T SKIN: Intact,warm,dry.  Poor skin turgor. PSYCH: Sedated on mechanical ventilation  Labs/imaging that I havepersonally reviewed  (right click and "Reselect all SmartList Selections" daily)  Lab data as noted below was reviewed by me.      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, pancytopenia Patient is an immunocompromised host VAP protocol once on vent Sedation protocol once on vent Status post bronchoscopy studies in process- BAL for cultures, cytology, cell count, pneumocystis PCR-see orders Failure to respond to antibiotics points to opportunistic infection versus malignancy Fungitell, CMV, histo PCR/titers Trend chest x-ray, ABG as  needed Trend procalcitonin, baseline: 0.45 Trend inflammatory markers  Shock, hypovolemic, versus septic History of hypotension Volume resuscitate Pressors as needed to maintain MAP>65 Stress dose steroids Continue home midodrine  Lactic acidosis Volume resuscitate Ensure adequate perfusion Pressors as needed Trend lactic acid  Refractory Anaplastic Large Cell Lymphoma Pancytopenia, coagulopathy Records from [facility] procured Reviewed records Prognosis guarded Status post oncology evaluation-appreciate input Dr. Grayland Ormond  Hyponatremia Likely related to hypovolemia Spot urine sodium and chloride Volume resuscitate with NS/crystalloid Monitor sodium    Best practice (right click and "Reselect all SmartList Selections" daily)  Diet:  NPO Pain/Anxiety/Delirium protocol (if indicated): Yes (RASS goal -2) VAP protocol (if indicated): Yes DVT prophylaxis: Contraindicated pancytopenia/coagulopathy GI prophylaxis: PPI Glucose control:  SSI No Central venous access:  N/A patient does have port in place (present PTA) Arterial line:  N/A Foley:  Yes, and it is still needed Mobility:  bed rest  PT consulted: N/A Last date of multidisciplinary goals of care discussion [6/12] Code Status:  full code Disposition: ICU  Labs   CBC: Recent Labs  Lab 02/17/2021 0255 02/02/21 0450 02/03/21 0445 02/04/21 0440  WBC 1.6* 2.4* 4.1 5.7  NEUTROABS 1.4* 2.2  --   --   HGB 9.9* 8.8* 9.4* 8.7*  HCT 28.6* 25.9* 27.0* 26.0*  MCV 76.7* 78.0* 76.7* 79.0*  PLT 67* 57* 65* 73*     Basic Metabolic Panel: Recent Labs  Lab 01/26/2021 0255 01/21/2021 0724 02/14/2021 0824 02/02/21 0448 02/03/21 0445 02/04/21 0440  NA 123*  --  125* 130* 128* 130*  K 3.6  --  3.5 4.2 3.9 4.2  CL 93*  --  96* 104 101 100  CO2 23  --  22 22 20* 19*  GLUCOSE 183*  --  183* 195* 292* 215*  BUN 24*  --  22 26* 39* 61*  CREATININE 1.27*  --  1.21 1.67* 2.28* 3.34*  CALCIUM 8.2*  --  7.4* 7.4*  7.8* 7.8*  MG  --  1.5*  --  1.7 1.8 2.0  PHOS  --   --  3.3 3.8 3.8 5.2*    GFR: Estimated Creatinine Clearance: 16.7 mL/min (A) (by C-G formula based on SCr of 3.34 mg/dL (H)). Recent Labs  Lab 01/29/2021 0255 01/22/2021 0606 02/11/2021 1804 02/04/2021 2157 02/02/21 0448 02/02/21 0450 02/03/21 0445 02/04/21 0440  PROCALCITON 0.45  --   --   --  1.07  --  2.21 1.90  WBC 1.6*  --   --   --   --  2.4* 4.1 5.7  LATICACIDVEN 3.0* 1.5 2.0* 1.5  --   --   --   --      Liver Function Tests: Recent Labs  Lab 02/07/2021 0255 02/05/2021 0824 02/02/21 0448  AST 48*  --  35  ALT 21  --  16  ALKPHOS 70  --  49  BILITOT 1.0  --  0.8  PROT 5.2*  --  3.9*  ALBUMIN 3.2* 2.3* 2.2*    No results for input(s): LIPASE, AMYLASE in the last 168 hours. No results for input(s): AMMONIA in the last 168 hours.  ABG    Component Value Date/Time   PHART 7.32 (L) 02/11/2021 1426   PCO2ART 38 02/13/2021 1426   PO2ART 78 (L) 02/03/2021 1426   HCO3 19.6 (L) 02/07/2021 1426   ACIDBASEDEF 6.0 (H) 02/19/2021 1426   O2SAT 94.3 02/02/2021 1426     Coagulation Profile: Recent Labs  Lab 02/02/2021 0255 02/02/21 0448  INR 1.3* 1.4*     Cardiac Enzymes: No results for input(s): CKTOTAL, CKMB, CKMBINDEX, TROPONINI in the last 168 hours.  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   Allergies Allergies  Allergen Reactions   Aspirin Swelling   Tylenol [Acetaminophen] Other (See Comments)    Red face, tongue, and lips  Home Medications  Prior to Admission medications   Medication Sig Start Date End Date Taking? Authorizing Provider  albuterol (VENTOLIN HFA) 108 (90 Base) MCG/ACT inhaler Inhale 2 puffs into the lungs every 6 (six) hours as needed for wheezing or shortness of breath. 01/08/21  Yes Regalado, Belkys A, MD  allopurinol (ZYLOPRIM) 300 MG tablet Take 1 tablet (300 mg total) by mouth daily. 01/08/21  Yes Regalado, Belkys A, MD  dextromethorphan-guaiFENesin (MUCINEX DM) 30-600 MG 12hr tablet Take 1 tablet by mouth 2 (two) times daily. Patient taking differently: Take 1 tablet by mouth 2 (two) times daily as needed for cough. 01/08/21  Yes Regalado, Belkys A, MD  ferrous sulfate 325 (65 FE) MG tablet Take 325 mg by mouth daily.   Yes [provider]  latanoprost (XALATAN) 0.005 % ophthalmic solution Place 1 drop into both eyes at bedtime.   Yes [provider]  magnesium gluconate (MAGONATE) 500 MG tablet Take 1 tablet (500 mg total) by mouth daily. 01/08/21  Yes Regalado, Belkys A, MD  midodrine (PROAMATINE) 2.5 MG tablet Take 1 tablet (2.5 mg total) by mouth 3 (three) times daily with meals. 01/08/21  Yes Regalado, Belkys A, MD  tamsulosin (FLOMAX) 0.4 MG CAPS capsule Take 1 capsule (0.4 mg total) by mouth daily. 01/08/21  Yes Regalado, Belkys A, MD  vitamin B-12 (CYANOCOBALAMIN) 1000 MCG tablet Take 1,000 mcg by mouth daily.   Yes [provider]    Scheduled Meds:  albuterol  2.5 mg Nebulization Q6H   allopurinol  300 mg Per Tube Daily   chlorhexidine gluconate  (MEDLINE KIT)  15 mL Mouth Rinse BID   Chlorhexidine Gluconate Cloth  6 each Topical Q0600   docusate  100 mg Per Tube BID   feeding supplement (PROSource TF)  45 mL Per Tube BID   ferrous sulfate  325 mg Oral Daily   free water  30 mL Per Tube Q4H   hydrocortisone sod succinate (SOLU-CORTEF) inj  100 mg Intravenous Q8H   latanoprost  1 drop Both Eyes QHS   mouth rinse  15 mL Mouth Rinse 10 times per day   midodrine  5 mg Per Tube TID WC   pantoprazole (PROTONIX) IV  40 mg Intravenous Q24H   polyethylene glycol  17 g Per Tube Daily   tamsulosin  0.4 mg Oral Daily   vitamin B-12  1,000 mcg Oral Daily   Continuous Infusions:  ampicillin-sulbactam (UNASYN) IV 3 g (02/04/21 0001)   azithromycin Stopped (02/03/21 1141)   feeding supplement (VITAL AF 1.2 CAL) 60 mL/hr at 02/04/21 0600   fentaNYL infusion INTRAVENOUS 200 mcg/hr (02/04/21 0600)   midazolam 1 mg/hr (02/04/21 0600)   norepinephrine (LEVOPHED) Adult infusion 3 mcg/min (02/04/21 0600)   PRN Meds:.acetaminophen **OR** acetaminophen, fentaNYL, midazolam, ondansetron **OR** ondansetron (ZOFRAN) IV, senna-docusate, sodium chloride flush  Critical care time: 33 minutes    The patient is critically ill with multiple organ systems failure and requires high complexity decision making for assessment and support, frequent evaluation and titration of therapies, application of advanced monitoring technologies and extensive interpretation of multiple databases. Critical Care Time devoted to patient care services described in this note is 90 minutes.    *This note was dictated using voice recognition software/Dragon.  Despite best efforts to proofread, errors can occur which can change the meaning.  Any change was purely unintentional.

## 2021-02-04 NOTE — Progress Notes (Signed)
Spoke to patients brother Hermenegildo Clausen and brothers wife. Discussed patients current status and informed them that Eddie Dibbles keeps calling to check on patient. Eddie Dibbles insists he is patients' POA. Laurann Montana that we do not have any paperwork on file to support this and if paperwork could be produced we would abide by wishes laid out in such paperwork. Patients brother opt's to set up pass code at this time. Called and provided pass code to patients friend [name] at family's request.

## 2021-02-04 NOTE — Progress Notes (Signed)
Palliative: Harry Strong is lying quietly in bed.  He is intubated/ventilated and sedated.  He appears acutely/chronically ill and quite frail.  Unable to make his basic needs known.  There is no family at bedside at this time.  Call to brother/HC POA, Tobie Lords.  We talk about Rich's acute health concerns including, but not limited to, ventilator support and weaning, CCM consult, infectious disease consult.  We talk about time frames for ventilator support.  Although Jenny Reichmann states that he does not believe Denice Paradise would want a tracheostomy, he is unable to set a time limit for ventilator support he asks about "pulling the plug".  I share that if trach is not an option for Rich, then "unburdening him from life support" after a trial would be appropriate.  John asks appropriate questions.  He tells me that he will reach out to reach his friend Lanny Hurst for support.   Call to friend, [name], at John's request.  No answer, unable to leave voicemail message.  Conference with attending, bedside nursing staff, transition of care team related to patient condition, needs, goals of care.  HC POA: Brother, Lovell Nuttall is [employer]'s [employer] traffic controller.  Plan:    At this point continue to treat the treatable but no CPR/defibrillation.  Time for outcomes.   79 minutes  Quinn Axe, NP Palliative medicine team Team phone [phone] Greater than 50% of this time was spent counseling and coordinating care related to the above assessment and plan.

## 2021-02-05 ENCOUNTER — Inpatient Hospital Stay: Payer: Medicare Other

## 2021-02-05 LAB — CBC
HCT: 26.2 % — ABNORMAL LOW (ref 39.0–52.0)
Hemoglobin: 9.1 g/dL — ABNORMAL LOW (ref 13.0–17.0)
MCH: 26.6 pg (ref 26.0–34.0)
MCHC: 34.7 g/dL (ref 30.0–36.0)
MCV: 76.6 fL — ABNORMAL LOW (ref 80.0–100.0)
Platelets: 59 10*3/uL — ABNORMAL LOW (ref 150–400)
RBC: 3.42 MIL/uL — ABNORMAL LOW (ref 4.22–5.81)
RDW: 17.8 % — ABNORMAL HIGH (ref 11.5–15.5)
WBC: 4.9 10*3/uL (ref 4.0–10.5)
nRBC: 0 % (ref 0.0–0.2)

## 2021-02-05 LAB — BASIC METABOLIC PANEL
Anion gap: 9 (ref 5–15)
BUN: 75 mg/dL — ABNORMAL HIGH (ref 8–23)
CO2: 18 mmol/L — ABNORMAL LOW (ref 22–32)
Calcium: 8 mg/dL — ABNORMAL LOW (ref 8.9–10.3)
Chloride: 101 mmol/L (ref 98–111)
Creatinine, Ser: 4.26 mg/dL — ABNORMAL HIGH (ref 0.61–1.24)
GFR, Estimated: 13 mL/min — ABNORMAL LOW (ref >60)
Glucose, Bld: 161 mg/dL — ABNORMAL HIGH (ref 70–99)
Potassium: 5.2 mmol/L — ABNORMAL HIGH (ref 3.5–5.1)
Sodium: 128 mmol/L — ABNORMAL LOW (ref 135–145)

## 2021-02-05 LAB — FUNGITELL, SERUM: Fungitell Result: <31 pg/mL (ref <80)

## 2021-02-05 LAB — CULTURE, BAL-QUANTITATIVE W GRAM STAIN
Culture: >=100000 — AB
Gram Stain: NONE SEEN

## 2021-02-05 LAB — GLUCOSE, CAPILLARY: Glucose-Capillary: 146 mg/dL — ABNORMAL HIGH (ref 70–99)

## 2021-02-05 LAB — PHOSPHORUS: Phosphorus: 6.3 mg/dL — ABNORMAL HIGH (ref 2.5–4.6)

## 2021-02-05 LAB — TRIGLYCERIDES: Triglycerides: 101 mg/dL (ref <150)

## 2021-02-05 LAB — MAGNESIUM: Magnesium: 2 mg/dL (ref 1.7–2.4)

## 2021-02-05 MED ORDER — BISACODYL 10 MG RE SUPP
10.0000 mg | Freq: Once | RECTAL | Status: AC
  Administered 2021-02-05: 10 mg via RECTAL
  Filled 2021-02-05: qty 1

## 2021-02-05 MED ORDER — MIDAZOLAM HCL 2 MG/2ML IJ SOLN
2.0000 mg | INTRAMUSCULAR | Status: DC | PRN
  Administered 2021-02-05: 2 mg via INTRAVENOUS

## 2021-02-05 MED ORDER — VITAL AF 1.2 CAL PO LIQD
1000.0000 mL | ORAL | Status: DC
  Administered 2021-02-05: 1000 mL

## 2021-02-05 MED ORDER — MIDAZOLAM HCL 2 MG/2ML IJ SOLN
INTRAMUSCULAR | Status: AC
  Filled 2021-02-05: qty 2

## 2021-02-05 MED ORDER — FENTANYL BOLUS VIA INFUSION
50.0000 ug | INTRAVENOUS | Status: DC | PRN
  Administered 2021-02-05 – 2021-02-06 (×5): 50 ug via INTRAVENOUS
  Filled 2021-02-05: qty 50

## 2021-02-05 MED ORDER — ALBUMIN HUMAN 25 % IV SOLN
12.5000 g | Freq: Four times a day (QID) | INTRAVENOUS | Status: AC
  Administered 2021-02-05 – 2021-02-06 (×4): 12.5 g via INTRAVENOUS
  Filled 2021-02-05 (×4): qty 50

## 2021-02-05 NOTE — Progress Notes (Signed)
Nutrition Follow Up Note   DOCUMENTATION CODES:   Non-severe (moderate) malnutrition in context of chronic illness  INTERVENTION:   Vital 1.2 @60ml/hr- Initiate at 20ml/hr- Once tolerating increase by 10ml/hr q 6 hours until goal rate is reached.  Pro-Source 45ml BID via tube, provides 40kcal and 11g of protein per serving   Free water flushes 30ml q4 hours to maintain tube patency   Regimen provides 1808k/day, 130g/day protein and 1348ml/day free water.   Pt at high refeed risk; recommend monitor potassium, magnesium and phosphorus labs daily until stable  NUTRITION DIAGNOSIS:   Moderate Malnutrition related to cancer and cancer related treatments as evidenced by moderate fat depletion, moderate muscle depletion.  GOAL:   Patient will meet greater than or equal to 90% of their needs - not met   MONITOR:   Labs, Vent status, Weight trends, TF tolerance, Skin, I & O's   ASSESSMENT:   86 y.o. male with medical history significant for B-cell lymphoma, BPH, gout, pancytopenia, chronic hypotension on midodrine, hospitalized from 5/13-5/19 with sepsis, multifocal pneumonia and respiratory failure, discharged on home O2 at 2 L who is admitted with PNA and sepsis.  -Enterococcus feacalis and staph heaemolyticus invasive pulmonary specimen via BAL  Pt remains sedated and ventilated. OGT noted with side port at the GE junction; tube has been advanced by RN and awaiting KUB. Tube feeds held yesterday as pt vomited. No abnormal gas pattern on KUB this morning. No additional vomiting. Plan is to restart tube feeds at trickle rate. No BM noted since admit; bowel regimen initiated. Renal values worse today. Palliative care following. Per chart, pt up ~24lbs since admit; pt + 8.0L on his I & O's.   Medications reviewed and include: colace, solu-cortef, protonix, miralax, albumin, B12, unasyn, precedex, fentanyl, levophed   Labs reviewed: Na 128(L), K 5.2 wnl, BUN 75(H), creat 4.26(H), P  6.3(H), Mg 2.0 wnl Hgb 9.1(L), Hct 26.2(L), MCV 76.6(L) Cbgs- 174, 202 x 24 hrs  Patient is currently intubated on ventilator support MV: 10.9 L/min Temp (24hrs), Avg:97.6 F (36.4 C), Min:96.9 F (36.1 C), Max:98.7 F (37.1 C)  Propofol: none   MAP- >65mmHg  UOP- 275ml   Diet Order:    Diet Order             Diet NPO time specified  Diet effective now                  EDUCATION NEEDS:   No education needs have been identified at this time  Skin:  Skin Assessment: Reviewed RN Assessment  Last BM:  pta  Height:   Ht Readings from Last 1 Encounters:  02/03/21 5' 9.49" (1.765 m)    Weight:   Wt Readings from Last 1 Encounters:  02/05/21 90.1 kg    Ideal Body Weight:  75.45 kg  BMI:  Body mass index is 28.92 kg/m.  Estimated Nutritional Needs:   Kcal:  1845kcal/day  Protein:  115-130g/day  Fluid:  2.0-2.3L/day  Harry Campbell MS, RD, LDN Please refer to [facility] for RD and/or RD on-call/weekend/after hours pager  

## 2021-02-05 NOTE — Progress Notes (Signed)
Palliative: Mr. Wister, Hoefle, is lying in bed.  He is intubated/ventilated and sedated.  He appears acutely/chronically ill.  He has failed weaning trials for the last few days.  Despite medical interventions, he continues to worsen and is progressing toward multisystem organ failure.  There is no family at bedside at this time.  Call to brother/healthcare surrogate, Tobie Lords.  We talk about Khoi's worsening health conditions.  John offers a life review for [employer].  He shares that he is agreeable to compassionate extubation.  He would like to reach his long-term friend, [name], to set the time for extubation.  Call to [facility].  No answer, unable to leave voicemail message. Second call to Lanny Hurst, left somewhat detailed voicemail message requesting for case to contact critical care team for date and time of compassionate extubation..   Conference with attending, bedside nursing staff, transition of care team related to patient condition, needs, goals of care, compassionate extubation with time being set up by longtime friend, [name].  Plan: Compassionate extubation on timeframe per [name].  2 minutes Quinn Axe, NP Palliative medicine team Team phone [phone] Greater than 50% of this time was spent counseling and coordinating care related to the above assessment and plan.   John sent email to this NP on 02/05/2021 as follows:  Aniceto Boss,  I had a long conversation with Lanny Hurst last night.  Without prompting, he made it very clear that Marrion didn't want to end up languishing in a nursing home.  That was my feeling, too, so it appears that Tzvi would want to be permitted to pass peacefully.  Lanny Hurst would like to be there at the end.  My son, Cindee Salt, may decide to make the trip from [place] to be there, too.  My wife is crippled, so I can't be there.  Lanny Hurst mentioned that he was the source of the animal heads mentioned in the [place] article, so you can see that he has been  Adarrius's friend for a long time.  I trust him.  Thanks!  Jenny Reichmann

## 2021-02-05 NOTE — Progress Notes (Signed)
Patient's clothing, watch, wallet and cell phone taken home by [name].

## 2021-02-05 NOTE — Progress Notes (Signed)
NAME:  Harry Strong, MRN:  [id number], DOB:  [birth year], LOS: 4 ADMISSION DATE:  01/24/2021, CONSULTATION DATE: 02/15/2021 REFERRING MD:  Wendee Beavers MD, CHIEF COMPLAINT: Of breath, hypoxia  History of Present Illness:  The patient is an 86 year old lifelong never smoker with a history as noted below who presented to [facility] in the early morning hours due to increasing shortness of breath.  He had been recently hospitalized at [facility] from 13 May through 19 May with presumed sepsis, presumed multifocal pneumonia and acute respiratory failure with hypoxia.  Received antibiotics and was discharged home on oxygen 2 L/min.  The patient presented to the emergency room in the early morning hours of today with persistent symptoms of cough, shortness of breath and increased work of breathing.  The patient also had noted lower extremity edema as well.  ED course was noted as follows: ED course: On arrival, tachypneic at 22-29 with O2 sat initially 97% on 2 L in the ER but following IV fluid bolus dropped to 88% on 2 L.  He was afebrile, BP 85/61 with pulse 93 Blood work significant for pancytopenia with WBC 1600, with lactic acid of 3 hemoglobin 9.1 and platelets 67,000.  Sodium 123, creatinine 1.27 up from 0.97 baseline.  Procalcitonin 0.45.  BNP 92 Imaging: Chest x-ray with extensive bilateral airspace disease, worsening on the left since prior study.  Findings are concerning for multifocal pneumonia.  Current imaging as well as prior imaging was independently reviewed.  The patient was initially treated with sepsis protocol with IV fluids and IV antibiotics.  He was having increased work of breathing and oxygen desaturations and was subsequently placed on BiPAP.  He was initially admitted by the hospitalist group to go to stepdown on BiPAP.  However upon transfer to the stepdown unit the patient continued to have increased work of breathing and oxygen desaturations despite BiPAP.  At that point PCCM was consulted.   On evaluation at the bedside the patient was exceedingly tachypneic, with severe conversational dyspnea.  He was however alert and oriented but in moderate to severe respiratory distress.  Intubation was explained to the patient.  He reiterated that he wanted to be full code.  Was recently relocated to the area from [place].  Has an extensive oncologic history treated at [facility].  We managed to procure records which are under the care everywhere tab.  The patient is single and has a close friend in the area by the name of [name] whom he share information with.  He does not mention any other relatives.  Further history could not be obtained due to the patient's severity of illness.  02/02/21- patients family Ray Tel has called he is reporting to be POA and he asked some questions regarding care and will speak with other emergency contact [name] for goals of care.  Patient remains sedated on mechanical ventilation with a focal pneumonia  02/03/21- patient remains on MV. Goals of care is in progress.  OGT feeds continued.  02/04/21- PRVC weaned to 35%, will perform SBT, patient has had episodes of Atrial fibrillation on telemetry.   02/05/21 - patient with worsening renal function and remains on ventilator.  Failed SBT and became very unstable due to severe respiratory distress.  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 rate (!) 24, height 5' 9.49" (1.765 m), weight 90.1 kg, SpO2 97 %.    Vent Mode: PRVC FiO2 (%):  [35 %] 35 % Set Rate:  [24 bmp] 24 bmp Vt Set:  [480 mL] 480 mL PEEP:  [5 cmH20] 5 cmH20 Plateau Pressure:  [17 cmH20-20 cmH20] 20 cmH20   Intake/Output Summary (Last 24 hours) at 02/05/2021 1239 Last data filed at 02/05/2021 1100 Gross per 24 hour  Intake 3492.91 ml  Output 1559 ml  Net 1933.91 ml    Filed Weights   02/03/21 0457 02/04/21 0427 02/05/21 0500  Weight: 81.3 kg 85.3 kg 90.1 kg   Examination: GENERAL: Chronically ill-appearing elderly male HEAD: Normocephalic, atraumatic.  EYES: Pupils equal, round, reactive to light.  No scleral icterus.  MOUTH: Oral mucosa moist, dentition intact. NECK: Supple. No thyromegaly. Trachea midline. No JVD.  No adenopathy. PULMONARY: Mechanical ventilation audible respiratory motion CARDIOVASCULAR: Tachycardic with frequent extrasystoles.  Cardiac sounds obscured by pulmonary sounds ABDOMEN: Nondistended, soft, nontender, no hepatomegaly, palpable spleen. MUSCULOSKELETAL: No joint deformity, no clubbing, +1 to +2 edema lower extremities.  NEUROLOGIC: Sedated with GCS 4 T SKIN: Intact,warm,dry.  Poor skin turgor. PSYCH: Sedated on mechanical ventilation  Labs/imaging that I havepersonally reviewed  (right click and "Reselect all SmartList Selections" daily)  Lab data as noted below was reviewed by me.      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, pancytopenia Patient is an immunocompromised host VAP protocol once on vent Sedation protocol once on vent Status post bronchoscopy studies in process- BAL for cultures, cytology, cell count,  pneumocystis PCR-see orders Failure to respond to antibiotics points to opportunistic infection versus malignancy Fungitell, CMV, histo PCR/titers Trend chest x-ray, ABG as needed Trend procalcitonin, baseline: 0.45 Trend inflammatory markers                  +Enterococcus feacalis  and staph heaemolyticus invasive pulmonary specimen via BAL   Shock, hypovolemic, versus septic History of hypotension Volume resuscitate Pressors as needed to maintain MAP>65 Stress dose steroids Continue home midodrine  Lactic acidosis Volume resuscitate Ensure adequate perfusion Pressors as needed Trend lactic acid  Refractory Anaplastic Large Cell Lymphoma Pancytopenia, coagulopathy Records from [facility] procured Reviewed records Prognosis guarded Status post oncology evaluation-appreciate input Dr. Grayland Ormond  Hyponatremia Likely related to hypovolemia Spot urine sodium and chloride Volume resuscitate with NS/crystalloid Monitor sodium    Best practice (right click and "Reselect all SmartList Selections" daily)  Diet:  NPO Pain/Anxiety/Delirium protocol (if indicated): Yes (RASS goal -2) VAP protocol (if indicated): Yes DVT prophylaxis: Contraindicated pancytopenia/coagulopathy GI prophylaxis: PPI Glucose control:  SSI No Central venous access:  N/A patient does have port in place (present PTA) Arterial line:  N/A Foley:  Yes, and it is still needed Mobility:  bed rest  PT consulted: N/A Last date of multidisciplinary goals of care discussion [6/12] Code Status:  full  code Disposition: ICU  Labs   CBC: Recent Labs  Lab 02/11/2021 0255 02/02/21 0450 02/03/21 0445 02/04/21 0440 02/05/21 0502  WBC 1.6* 2.4* 4.1 5.7 4.9  NEUTROABS 1.4* 2.2  --   --   --   HGB 9.9* 8.8* 9.4* 8.7* 9.1*  HCT 28.6* 25.9* 27.0* 26.0* 26.2*  MCV 76.7* 78.0* 76.7* 79.0* 76.6*  PLT 67* 57* 65* 73* 59*     Basic Metabolic Panel: Recent Labs  Lab 02/02/21 0448 02/03/21 0445  02/04/21 0440 02/04/21 0935 02/05/21 0502  NA 130* 128* 130* 128* 128*  K 4.2 3.9 4.2 4.2 5.2*  CL 104 101 100 99 101  CO2 22 20* 19* 18* 18*  GLUCOSE 195* 292* 215* 211* 161*  BUN 26* 39* 61* 64* 75*  CREATININE 1.67* 2.28* 3.34* 3.68* 4.26*  CALCIUM 7.4* 7.8* 7.8* 8.0* 8.0*  MG 1.7 1.8 2.0 2.0 2.0  PHOS 3.8 3.8 5.2* 5.7* 6.3*    GFR: Estimated Creatinine Clearance: 14.4 mL/min (A) (by C-G formula based on SCr of 4.26 mg/dL (H)). Recent Labs  Lab 02/02/2021 0255 01/29/2021 0606 02/11/2021 1804 02/09/2021 2157 02/02/21 0448 02/02/21 0450 02/03/21 0445 02/04/21 0440 02/05/21 0502  PROCALCITON 0.45  --   --   --  1.07  --  2.21 1.90  --   WBC 1.6*  --   --   --   --  2.4* 4.1 5.7 4.9  LATICACIDVEN 3.0* 1.5 2.0* 1.5  --   --   --   --   --      Liver Function Tests: Recent Labs  Lab 01/31/2021 0255 02/02/2021 0824 02/02/21 0448  AST 48*  --  35  ALT 21  --  16  ALKPHOS 70  --  49  BILITOT 1.0  --  0.8  PROT 5.2*  --  3.9*  ALBUMIN 3.2* 2.3* 2.2*    No results for input(s): LIPASE, AMYLASE in the last 168 hours. No results for input(s): AMMONIA in the last 168 hours.  ABG    Component Value Date/Time   PHART 7.32 (L) 02/07/2021 1426   PCO2ART 38 01/21/2021 1426   PO2ART 78 (L) 01/23/2021 1426   HCO3 19.6 (L) 02/17/2021 1426   ACIDBASEDEF 6.0 (H) 02/07/2021 1426   O2SAT 94.3 01/22/2021 1426     Coagulation Profile: Recent Labs  Lab 02/13/2021 0255 02/02/21 0448  INR 1.3* 1.4*     Cardiac Enzymes: No results for input(s): CKTOTAL, CKMB, CKMBINDEX, TROPONINI in the last 168 hours.  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   Allergies Allergies  Allergen Reactions   Aspirin Swelling   Tylenol [Acetaminophen] Other (See Comments)    Red face, tongue, and lips     Home Medications  Prior to Admission medications   Medication Sig Start Date End Date Taking? Authorizing Provider  albuterol (VENTOLIN HFA) 108 (90 Base) MCG/ACT inhaler Inhale 2 puffs into the lungs every 6 (six) hours as needed for wheezing or shortness of breath. 01/08/21  Yes Regalado, Belk[place] A, MD  allopurinol (ZYLOPRIM) 300 MG tablet Take 1 tablet (300 mg total) by mouth daily. 01/08/21  Yes Regalado, Belkys A, MD  dextromethorphan-guaiFENesin (MUCINEX DM) 30-600 MG 12hr tablet Take 1 tablet by mouth 2 (two) times daily. Patient taking differently: Take 1 tablet by mouth 2 (two) times daily as needed for cough. 01/08/21  Yes Regalado, Belkys A, MD  ferrous sulfate 325 (65 FE) MG tablet Take 325 mg by mouth daily.   Yes [provider]  latanoprost (XALATAN) 0.005 % ophthalmic solution Place 1 drop into both eyes at bedtime.   Yes [provider]  magnesium gluconate (MAGONATE) 500 MG tablet Take 1 tablet (500 mg total) by mouth daily. 01/08/21  Yes Regalado, Belkys A, MD  midodrine (PROAMATINE) 2.5 MG tablet Take 1 tablet (2.5 mg total) by mouth 3 (three) times daily with meals. 01/08/21  Yes Regalado, Belkys A, MD  tamsulosin (FLOMAX) 0.4 MG CAPS capsule Take 1 capsule  (0.4 mg total) by mouth daily. 01/08/21  Yes Regalado, Belkys A, MD  vitamin B-12 (CYANOCOBALAMIN) 1000 MCG tablet Take 1,000 mcg by mouth daily.   Yes [provider]    Scheduled Meds:  bisacodyl  10 mg Rectal Once   chlorhexidine gluconate (MEDLINE KIT)  15 mL Mouth Rinse BID   Chlorhexidine Gluconate Cloth  6 each Topical Q0600   docusate  100 mg Per Tube BID   feeding supplement (PROSource TF)  45 mL Per Tube BID   free water  30 mL Per Tube Q4H   hydrocortisone sod succinate (SOLU-CORTEF) inj  100 mg Intravenous Q8H   latanoprost  1 drop Both Eyes QHS   mouth rinse  15 mL Mouth Rinse 10 times per day   midodrine  5 mg Per Tube TID WC   pantoprazole (PROTONIX) IV  40 mg Intravenous Q24H   polyethylene glycol  17 g Per Tube Daily   tamsulosin  0.4 mg Oral Daily   Continuous Infusions:  albumin human     ampicillin-sulbactam (UNASYN) IV Stopped (02/05/21 0111)   dexmedetomidine (PRECEDEX) IV infusion 0.7 mcg/kg/hr (02/05/21 1100)   feeding supplement (VITAL AF 1.2 CAL) Stopped (02/04/21 0900)   fentaNYL infusion INTRAVENOUS 150 mcg/hr (02/05/21 1100)   norepinephrine (LEVOPHED) Adult infusion 2 mcg/min (02/05/21 1100)   PRN Meds:.acetaminophen **OR** acetaminophen, fentaNYL, midazolam, ondansetron **OR** ondansetron (ZOFRAN) IV, senna-docusate, sodium chloride flush  Critical care time: 33 minutes    Critical care provider statement:    Critical care time (minutes):  33   Critical care time was exclusive of:  Separately billable procedures and  treating other patients   Critical care was necessary to treat or prevent imminent or  life-threatening deterioration of the following conditions:  Pneumonia, widespread cancer, renal failure, severe acute hypoxemic respiratory failure.    Critical care was time spent personally by me on the following  activities:  Development of treatment plan with patient or surrogate,  discussions with consultants,  evaluation of patient's  response to  treatment, examination of patient, obtaining history from patient or  surrogate, ordering and performing treatments and interventions, ordering  and review of laboratory studies and re-evaluation of patient's condition   I assumed direction of critical care for this patient from another  provider in my specialty: no       *This note was dictated using voice recognition software/Dragon.  Despite best efforts to proofread, errors can occur which can change the meaning.  Any change was purely unintentional.

## 2021-02-06 LAB — PHOSPHORUS: Phosphorus: 7.4 mg/dL — ABNORMAL HIGH (ref 2.5–4.6)

## 2021-02-06 LAB — PNEUMOCYSTIS PCR: Result Pneumocystis PCR: NEGATIVE

## 2021-02-06 LAB — CBC
HCT: 23 % — ABNORMAL LOW (ref 39.0–52.0)
Hemoglobin: 7.9 g/dL — ABNORMAL LOW (ref 13.0–17.0)
MCH: 26.6 pg (ref 26.0–34.0)
MCHC: 34.3 g/dL (ref 30.0–36.0)
MCV: 77.4 fL — ABNORMAL LOW (ref 80.0–100.0)
Platelets: 41 10*3/uL — ABNORMAL LOW (ref 150–400)
RBC: 2.97 MIL/uL — ABNORMAL LOW (ref 4.22–5.81)
RDW: 17.9 % — ABNORMAL HIGH (ref 11.5–15.5)
WBC: 2.3 10*3/uL — ABNORMAL LOW (ref 4.0–10.5)
nRBC: 0 % (ref 0.0–0.2)

## 2021-02-06 LAB — BASIC METABOLIC PANEL
Anion gap: 10 (ref 5–15)
BUN: 91 mg/dL — ABNORMAL HIGH (ref 8–23)
CO2: 17 mmol/L — ABNORMAL LOW (ref 22–32)
Calcium: 7.9 mg/dL — ABNORMAL LOW (ref 8.9–10.3)
Chloride: 101 mmol/L (ref 98–111)
Creatinine, Ser: 4.9 mg/dL — ABNORMAL HIGH (ref 0.61–1.24)
GFR, Estimated: 11 mL/min — ABNORMAL LOW (ref >60)
Glucose, Bld: 180 mg/dL — ABNORMAL HIGH (ref 70–99)
Potassium: 5.5 mmol/L — ABNORMAL HIGH (ref 3.5–5.1)
Sodium: 128 mmol/L — ABNORMAL LOW (ref 135–145)

## 2021-02-06 LAB — FUNGITELL, SERUM: Fungitell Result: <31 pg/mL (ref <80)

## 2021-02-06 LAB — CMV QUANT DNA PCR (URINE)
CMV Qn DNA PCR (Urine): NEGATIVE copies/mL
Log10 CMV Qn DCA Ur: UNDETERMINED log10copy/mL

## 2021-02-06 LAB — MAGNESIUM: Magnesium: 2.1 mg/dL (ref 1.7–2.4)

## 2021-02-06 LAB — ASPERGILLUS ANTIGEN, BAL/SERUM: Aspergillus Ag, BAL/Serum: 0.03 Index (ref 0.00–0.49)

## 2021-02-06 MED ORDER — LORAZEPAM 2 MG/ML IJ SOLN
INTRAMUSCULAR | Status: AC
  Administered 2021-02-06: 2 mg via INTRAVENOUS
  Filled 2021-02-06: qty 1

## 2021-02-06 MED ORDER — DEXTROSE 50 % IV SOLN
12.5000 g | Freq: Once | INTRAVENOUS | Status: AC
  Administered 2021-02-06: 12.5 g via INTRAVENOUS
  Filled 2021-02-06: qty 50

## 2021-02-06 MED ORDER — GLYCOPYRROLATE 0.2 MG/ML IJ SOLN
0.4000 mg | INTRAMUSCULAR | Status: DC | PRN
  Administered 2021-02-06: 0.4 mg via INTRAVENOUS
  Filled 2021-02-06: qty 2

## 2021-02-06 MED ORDER — LORAZEPAM 2 MG/ML IJ SOLN: 2.0000 mg | INTRAMUSCULAR | Status: DC | PRN

## 2021-02-06 MED ORDER — INSULIN ASPART 100 UNIT/ML IV SOLN
10.0000 [IU] | Freq: Once | INTRAVENOUS | Status: AC
  Administered 2021-02-06: 10 [IU] via INTRAVENOUS
  Filled 2021-02-06: qty 0.1

## 2021-02-09 LAB — CULTURE, BLOOD (ROUTINE X 2)
Culture: NO GROWTH
Culture: NO GROWTH
Special Requests: ADEQUATE

## 2021-02-10 LAB — VIRUS CULTURE

## 2021-02-20 NOTE — Death Summary Note (Signed)
DEATH SUMMARY   Patient Details  Name: Harry Strong MRN: [id number] DOB: 01/16/1935  Admission/Discharge Information   Admit Date:  Feb 07, 2021  Date of Death: Date of Death: Feb 12, 2021  Time of Death:  14:33  Length of Stay: 5  Referring Physician: Mechele Claude, FNP   Reason(s) for Hospitalization  Acute Hypoxic Respiratory Failure Multifocal Pneumonia (Enterococcus feacalis and staph heaemolyticus Pneumonia) Circulatory Shock Lactic Acidosis Refractory Anaplastic Large Cell Lymphoma Pancytopenia Hyponatremia  Diagnoses  Preliminary cause of death:  Enterococcus feacalis and staph heaemolyticus Pneumonia Secondary Diagnoses (including complications and co-morbidities):  Principal Problem:   Multifocal pneumonia Active Problems:   Pancytopenia ([place])   Hyponatremia   Acute respiratory failure with hypoxia ([facility])   Anaplastic large cell lymphoma, 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  He had been recently hospitalized at [facility] from 13 May through 19 May with presumed sepsis, presumed multifocal pneumonia and acute respiratory failure with hypoxia.  Received antibiotics and was discharged home on oxygen 2 L/min.  The patient presented to the emergency room in the early morning hours of today with persistent symptoms of cough, shortness of breath and increased work of breathing.  The patient also had noted lower extremity edema as well.  ED course was noted as follows: ED course: On arrival, tachypneic at 22-29 with O2 sat initially 97% on 2 L in the ER but following IV fluid bolus dropped to 88% on 2 L.  He was afebrile, BP 85/61 with  pulse 93 Blood work significant for pancytopenia with WBC 1600, with lactic acid of 3 hemoglobin 9.1 and platelets 67,000.  Sodium 123, creatinine 1.27 up from 0.97 baseline.  Procalcitonin 0.45.  BNP 92 Imaging: Chest x-ray with extensive bilateral airspace disease, worsening on the left since prior study.  Findings are concerning for multifocal pneumonia.  Current imaging as well as prior imaging was independently reviewed.   The patient was initially treated with sepsis protocol with IV fluids and IV antibiotics.  He was having increased work of breathing and oxygen desaturations and was subsequently placed on BiPAP.  He was initially admitted by the hospitalist group to go to stepdown on BiPAP.  However upon transfer to the stepdown unit the patient continued to have increased work of breathing and oxygen desaturations despite BiPAP.  At that point PCCM was consulted.  On evaluation at the bedside the patient was exceedingly tachypneic, with severe conversational dyspnea.  He was however alert and oriented but in moderate to severe respiratory distress.  Intubation was explained to the patient.  He reiterated that he wanted to be full code.   Was recently relocated to the area from [place].  Has an extensive oncologic history treated at [facility].  We managed to procure records which are under the care everywhere tab.  The patient is single and has a close friend in the area by the name of [name] whom he share information with.  He does not mention any other relatives.   Further history could not be obtained due to the patient's severity of illness.   02/02/21- patients family Ray Tel has called he is reporting to  be POA and he asked some questions regarding care and will speak with other emergency contact [name] for goals of care.  Patient remains sedated on mechanical ventilation with a focal pneumonia   02/03/21- patient remains on MV. Goals of care is in progress.  OGT  feeds continued. 02/04/21- PRVC weaned to 35%, will perform SBT, patient has had episodes of Atrial fibrillation on telemetry.   02/05/21 - patient with worsening renal function and remains on ventilator.  Failed SBT and became very unstable due to severe respiratory distress. 2021-02-27- Multiple meetings via phone, elink and in person with various family and friends today.  We allowed multiple in person visits as patient is passing away.  Plan for comfort care.  On 27-Feb-2021 his family and friends elected to transition to comfort measures and withdraw all life sustaining treatments.  He expired shortly after care withdrawn.    Pertinent Labs and Studies  Significant Diagnostic Studies DG Chest Port 1 View  Result Date: 02/05/2021 CLINICAL DATA:  Respiratory failure, check endotracheal tube EXAM: PORTABLE CHEST 1 VIEW COMPARISON:  02/02/2021 FINDINGS: Endotracheal tube is noted in satisfactory position. Gastric catheter extends into the stomach. Right chest wall port is noted in satisfactory position. Diffuse bilateral airspace opacities are again identified mildly improved from the prior study. No new focal infiltrate is seen. IMPRESSION: Slight improvement in multifocal pneumonia. Tubes and lines as described. Electronically Signed   By: Inez Catalina M.D.   On: 02/05/2021 11:08   Portable Chest xray  Result Date: 02/02/2021 CLINICAL DATA:  Acute respiratory failure EXAM: PORTABLE CHEST 1 VIEW COMPARISON:  Radiograph 02/15/2021 FINDINGS: Accessed right Port-A-Cath tip terminates at the superior cavoatrial junction Endotracheal tube tip terminates 3 cm from the carina. Transesophageal tube tip and side port terminate distal to the GE junction, tip below the margins of imaging. Telemetry leads and support devices overlie the chest. Widespread heterogeneous opacities throughout both lungs demonstrate some interval shifting opacity with clearing towards the right lung apex but increasing density in the lung  bases though this may be related to some diminished lung volumes when compared to priors. Trace bilateral effusions. No visible pneumothorax. Cardiomediastinal contours are partially obscured with visible portions not significantly changed from comparison. No acute osseous or chest wall abnormalities. IMPRESSION: Diffuse heterogeneous opacities persist throughout both lungs with some shifting opacity largely attributable to diminishing lung volumes. Support devices as above. Electronically Signed   By: Lovena Le M.D.   On: 02/02/2021 04:10   DG Chest Port 1 View  Result Date: 02/16/2021 CLINICAL DATA:  Intubation EXAM: PORTABLE CHEST 1 VIEW COMPARISON:  February 01, 2021 FINDINGS: The cardiomediastinal silhouette is unchanged in contour.RIGHT chest port with tip terminating over the superior cavoatrial junction. Enteric tube side port projects over the GE junction. ETT tip terminates 3.7 cm above the carina. No pleural effusion. No pneumothorax. Upper lobe predominant bilateral heterogeneous opacities, similar in comparison to prior. Visualized abdomen is unremarkable. Multilevel degenerative changes of the thoracic spine. IMPRESSION: 1. ETT tip terminates 3.7 cm above the carina. 2. Enteric tube side port projects at the GE junction. Recommend advancement. Electronically Signed   By: Valentino Saxon MD   On: 01/21/2021 14:23   DG Chest Port 1 View  Result Date: 01/21/2021 CLINICAL DATA:  Questionable sepsis EXAM: PORTABLE CHEST 1 VIEW COMPARISON:  01/02/2021 FINDINGS: Extensive bilateral airspace disease throughout both lungs, most confluent in the right upper lobe and diffusely throughout the left lung. This is worsened throughout the left  lung since prior study. Findings are concerning for multifocal pneumonia. Right Port-A-Cath remains in place, unchanged. Heart is normal size. No visible effusions or pneumothorax. IMPRESSION: Extensive bilateral airspace disease, worsening on the left since prior  study. Findings are concerning for multifocal pneumonia. Electronically Signed   By: Rolm Baptise M.D.   On: 01/22/2021 03:06   DG Abd Portable 1V  Result Date: 02/05/2021 CLINICAL DATA:  NG placement EXAM: PORTABLE ABDOMEN - 1 VIEW COMPARISON:  02/05/2021 FINDINGS: NG tube has been advanced with the tip now in the antrum of the stomach. No dilated bowel loops identified. IMPRESSION: NG tube advanced to the antrum of the stomach. No dilated bowel loops. Electronically Signed   By: Franchot Gallo M.D.   On: 02/05/2021 14:37   DG Abd Portable 1V  Result Date: 02/05/2021 CLINICAL DATA:  Check gastric catheter placement EXAM: PORTABLE ABDOMEN - 1 VIEW COMPARISON:  None. FINDINGS: Gastric catheter is noted within the stomach. The proximal side port is noted at the gastroesophageal junction. This should be advanced deeper into the stomach. Scattered large and small bowel gas is noted. No free air is seen. IMPRESSION: Gastric catheter as described. This should be advanced deeper into the stomach. Electronically Signed   By: Inez Catalina M.D.   On: 02/05/2021 11:07    Microbiology No results found for this or any previous visit (from the past 240 hour(s)).  Lab Basic Metabolic Panel: No results for input(s): NA, K, CL, CO2, GLUCOSE, BUN, CREATININE, CALCIUM, MG, PHOS in the last 168 hours. Liver Function Tests: No results for input(s): AST, ALT, ALKPHOS, BILITOT, PROT, ALBUMIN in the last 168 hours. No results for input(s): LIPASE, AMYLASE in the last 168 hours. No results for input(s): AMMONIA in the last 168 hours. CBC: No results for input(s): WBC, NEUTROABS, HGB, HCT, MCV, PLT in the last 168 hours. Cardiac Enzymes: No results for input(s): CKTOTAL, CKMB, CKMBINDEX, TROPONINI in the last 168 hours. Sepsis Labs: No results for input(s): PROCALCITON, WBC, LATICACIDVEN in the last 168 hours.  Procedures/Operations  02/17/2021: Endotracheal intubation 02/14/2021: Bronchoscopy with Bronchoalveolar  lavage     Darel Hong, AGACNP-BC [facility] Prefer epic messenger for cross cover needs If after hours, please call E-link  Bradly Bienenstock 02/16/2021, 7:26 PM

## 2021-02-20 NOTE — Progress Notes (Signed)
NAME:  Quamir Willemsen, MRN:  [id number], DOB:  1934/08/31, LOS: 5 ADMISSION DATE:  01/22/2021, CONSULTATION DATE: 02/15/2021 REFERRING MD:  Wendee Beavers MD, CHIEF COMPLAINT: Of breath, hypoxia  History of Present Illness:  The patient is an 86 year old lifelong never smoker with a history as noted below who presented to [facility] in the early morning hours due to increasing shortness of breath.  He had been recently hospitalized at [facility] from 13 May through 19 May with presumed sepsis, presumed multifocal pneumonia and acute respiratory failure with hypoxia.  Received antibiotics and was discharged home on oxygen 2 L/min.  The patient presented to the emergency room in the early morning hours of today with persistent symptoms of cough, shortness of breath and increased work of breathing.  The patient also had noted lower extremity edema as well.  ED course was noted as follows: ED course: On arrival, tachypneic at 22-29 with O2 sat initially 97% on 2 L in the ER but following IV fluid bolus dropped to 88% on 2 L.  He was afebrile, BP 85/61 with pulse 93 Blood work significant for pancytopenia with WBC 1600, with lactic acid of 3 hemoglobin 9.1 and platelets 67,000.  Sodium 123, creatinine 1.27 up from 0.97 baseline.  Procalcitonin 0.45.  BNP 92 Imaging: Chest x-ray with extensive bilateral airspace disease, worsening on the left since prior study.  Findings are concerning for multifocal pneumonia.  Current imaging as well as prior imaging was independently reviewed.  The patient was initially treated with sepsis protocol with IV fluids and IV antibiotics.  He was having increased work of breathing and oxygen desaturations and was subsequently placed on BiPAP.  He was initially admitted by the hospitalist group to go to stepdown on BiPAP.  However upon transfer to the stepdown unit the patient continued to have increased work of breathing and oxygen desaturations despite BiPAP.  At that point PCCM was consulted.   On evaluation at the bedside the patient was exceedingly tachypneic, with severe conversational dyspnea.  He was however alert and oriented but in moderate to severe respiratory distress.  Intubation was explained to the patient.  He reiterated that he wanted to be full code.  Was recently relocated to the area from [place].  Has an extensive oncologic history treated at [facility]  We managed to procure records which are under the care everywhere tab.  The patient is single and has a close friend in the area by the name of [name] whom he share information with.  He does not mention any other relatives.  Further history could not be obtained due to the patient's severity of illness.  02/02/21- patients family Ray Tel has called he is reporting to be POA and he asked some questions regarding care and will speak with other emergency contact [name] for goals of care.  Patient remains sedated on mechanical ventilation with a focal pneumonia  02/03/21- patient remains on MV. Goals of care is in progress.  OGT feeds continued.  02/04/21- PRVC weaned to 35%, will perform SBT, patient has had episodes of Atrial fibrillation on telemetry.   02/05/21 - patient with worsening renal function and remains on ventilator.  Failed SBT and became very unstable due to severe respiratory distress. 02/21/21- Multiple meetings via phone, elink and in person with various family and friends today.  We allowed multiple in person visits as patient is passing away.  Plan for comfort care.   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 rate (!) 23, height 5' 9.49" (1.765 m), weight 91.7 kg, SpO2 97 %.    Vent Mode: PRVC FiO2 (%):  [35 %] 35 % Set Rate:  [24 bmp] 24 bmp Vt Set:  [480 mL] 480 mL PEEP:  [5 cmH20] 5 cmH20 Plateau Pressure:  [19 cmH20-20 cmH20] 20 cmH20   Intake/Output Summary (Last 24 hours) at 2021/02/27 1424 Last data filed at 27-Feb-2021 1300 Gross per 24 hour  Intake 1827.86 ml  Output 180 ml  Net 1647.86 ml    Filed Weights   02/04/21 0427 02/05/21 0500 2021/02/27 0445  Weight: 85.3 kg 90.1 kg 91.7 kg   Examination: GENERAL: Chronically ill-appearing elderly male HEAD: Normocephalic, atraumatic.  EYES: Pupils equal, round, reactive to light.  No scleral icterus.  MOUTH: Oral mucosa moist, dentition intact. NECK: Supple. No thyromegaly. Trachea midline. No JVD.  No adenopathy. PULMONARY: Mechanical ventilation audible respiratory motion CARDIOVASCULAR: Tachycardic with frequent extrasystoles.  Cardiac sounds obscured by pulmonary sounds ABDOMEN: Nondistended, soft, nontender, no hepatomegaly, palpable spleen. MUSCULOSKELETAL: No joint deformity, no clubbing, +1 to +2 edema lower extremities.  NEUROLOGIC: Sedated with GCS 4 T SKIN: Intact,warm,dry.  Poor skin turgor. PSYCH: Sedated on mechanical ventilation  Labs/imaging that I havepersonally reviewed  (right click and "Reselect all SmartList Selections" daily)  Lab data as noted below was reviewed by me.      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,  pancytopenia Patient is an immunocompromised host VAP protocol once on vent Sedation protocol once on vent Status post bronchoscopy studies in process- BAL for cultures, cytology, cell count, pneumocystis PCR-see orders Failure to respond to antibiotics points to opportunistic infection versus malignancy Fungitell, CMV, histo PCR/titers Trend chest x-ray, ABG as needed Trend procalcitonin, baseline: 0.45 Trend inflammatory markers                  +Enterococcus feacalis  and staph heaemolyticus invasive pulmonary specimen via BAL   Shock, hypovolemic, versus septic History of hypotension Volume resuscitate Pressors as needed to maintain MAP>65 Stress dose steroids Continue home midodrine  Lactic acidosis Volume resuscitate Ensure adequate perfusion Pressors as needed Trend lactic acid  Refractory Anaplastic Large Cell Lymphoma Pancytopenia, coagulopathy Records from [facility] procured Reviewed records Prognosis guarded Status post oncology evaluation-appreciate input Dr. Grayland Ormond  Hyponatremia Likely related to hypovolemia Spot urine sodium and chloride Volume resuscitate with NS/crystalloid Monitor sodium    Best practice (right click and "Reselect all SmartList Selections" daily)  Diet:  NPO Pain/Anxiety/Delirium protocol (if indicated): Yes (RASS goal -2) VAP protocol (if indicated): Yes DVT prophylaxis: Contraindicated pancytopenia/coagulopathy GI prophylaxis: PPI Glucose control:  SSI No Central venous access:  N/A patient does have port in place (present PTA) Arterial line:  N/A Foley:  Yes, and it is still needed Mobility:  bed rest  PT consulted: N/A Last date of multidisciplinary goals of care discussion [6/12] Code Status:  full code Disposition: ICU  Labs   CBC: Recent Labs  Lab 02/11/2021 0255 02/02/21 0450 02/03/21 0445 02/04/21 0440 02/05/21 0502 07-Mar-2021 0444  WBC 1.6* 2.4* 4.1 5.7 4.9 2.3*  NEUTROABS 1.4* 2.2  --   --   --    --   HGB 9.9* 8.8* 9.4* 8.7* 9.1* 7.9*  HCT 28.6* 25.9* 27.0* 26.0* 26.2* 23.0*  MCV 76.7* 78.0* 76.7* 79.0* 76.6* 77.4*  PLT 67* 57* 65* 73* 59* 41*     Basic Metabolic Panel: Recent Labs  Lab 02/03/21 0445 02/04/21 0440 02/04/21 0935 02/05/21 0502 03/07/2021 0444  NA 128* 130* 128* 128* 128*  K 3.9 4.2 4.2 5.2* 5.5*  CL 101 100 99 101 101  CO2 20* 19* 18* 18* 17*  GLUCOSE 292* 215* 211* 161* 180*  BUN 39* 61* 64* 75* 91*  CREATININE 2.28* 3.34* 3.68* 4.26* 4.90*  CALCIUM 7.8* 7.8* 8.0* 8.0* 7.9*  MG 1.8 2.0 2.0 2.0 2.1  PHOS 3.8 5.2* 5.7* 6.3* 7.4*    GFR: Estimated Creatinine Clearance: 12.7 mL/min (A) (by C-G formula based on SCr of 4.9 mg/dL (H)). Recent Labs  Lab 01/27/2021 0255 01/24/2021 0606 01/31/2021 1804 02/04/2021 2157 02/02/21 0448 02/02/21 0450 02/03/21 0445 02/04/21 0440 02/05/21 0502 Mar 07, 2021 0444  PROCALCITON 0.45  --   --   --  1.07  --  2.21 1.90  --   --   WBC 1.6*  --   --   --   --    < > 4.1 5.7 4.9 2.3*  LATICACIDVEN 3.0* 1.5 2.0* 1.5  --   --   --   --   --   --    < > = values in this interval not displayed.     Liver Function Tests: Recent Labs  Lab 02/08/2021 0255 01/25/2021 0824 02/02/21 0448  AST 48*  --  35  ALT 21  --  16  ALKPHOS 70  --  49  BILITOT 1.0  --  0.8  PROT 5.2*  --  3.9*  ALBUMIN 3.2* 2.3* 2.2*    No results for input(s): LIPASE, AMYLASE in the last 168 hours. No results for input(s): AMMONIA in the last 168 hours.  ABG    Component Value Date/Time   PHART 7.32 (L) 02/09/2021 1426   PCO2ART 38 01/26/2021 1426   PO2ART 78 (L) 02/02/2021 1426   HCO3 19.6 (L) 01/28/2021 1426   ACIDBASEDEF 6.0 (H) 02/17/2021 1426   O2SAT 94.3 02/14/2021 1426     Coagulation Profile: Recent Labs  Lab 02/05/2021 0255 02/02/21 0448  INR 1.3* 1.4*     Cardiac Enzymes: No results for input(s): CKTOTAL, CKMB, CKMBINDEX, TROPONINI in the last 168 hours.  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   Allergies Allergies  Allergen Reactions   Aspirin Swelling   Tylenol [Acetaminophen] Other (See Comments)    Red face, tongue, and lips     Home Medications  Prior to Admission medications   Medication Sig Start Date End Date Taking? Authorizing Provider  albuterol (VENTOLIN HFA) 108 (90 Base) MCG/ACT inhaler Inhale 2 puffs into the lungs every 6 (six) hours as needed for wheezing or shortness of breath. 01/08/21  Yes Regalado, Belkys A, MD  allopurinol (ZYLOPRIM) 300 MG tablet Take 1 tablet (300 mg total) by mouth daily. 01/08/21  Yes Regalado, Belkys A, MD  dextromethorphan-guaiFENesin (MUCINEX DM) 30-600 MG 12hr tablet Take 1 tablet by mouth 2 (two) times daily. Patient taking differently: Take 1 tablet by mouth 2 (two) times daily as needed for cough. 01/08/21  Yes Regalado, Belkys A, MD  ferrous sulfate 325 (65 FE) MG tablet Take 325 mg by mouth daily.   Yes [provider]  latanoprost (XALATAN) 0.005 % ophthalmic solution Place 1 drop into both eyes at bedtime.   Yes [provider]  magnesium gluconate (MAGONATE) 500 MG tablet Take 1 tablet (500 mg total) by mouth daily. 01/08/21  Yes Regalado, Belkys A, MD  midodrine (PROAMATINE) 2.5 MG tablet Take 1 tablet (2.5 mg total) by mouth 3 (three) times daily with meals. 01/08/21  Yes Regalado, Belkys A, MD  tamsulosin (FLOMAX) 0.4 MG CAPS capsule Take 1 capsule (0.4 mg total) by mouth daily. 01/08/21  Yes Regalado, Belkys A, MD  vitamin B-12 (CYANOCOBALAMIN) 1000 MCG tablet Take 1,000 mcg by mouth daily.   Yes [provider]    Scheduled Meds:  latanoprost  1 drop Both Eyes QHS   mouth rinse  15 mL Mouth Rinse 10 times per day   Continuous Infusions:  dexmedetomidine (PRECEDEX) IV infusion Stopped (Feb 23, 2021 1403)   fentaNYL infusion INTRAVENOUS 350 mcg/hr (02/23/2021 1401)   PRN Meds:.acetaminophen **OR** acetaminophen, fentaNYL, glycopyrrolate, LORazepam, ondansetron **OR** ondansetron (ZOFRAN) IV, sodium chloride flush  Critical care time: 109  minutes    Critical care provider statement:    Critical care time (minutes): 109   Critical care time was exclusive of:  Separately billable procedures and  treating other patients   Critical care was necessary to treat or prevent imminent or  life-threatening deterioration of the following conditions:  Pneumonia, widespread cancer, renal failure, severe acute hypoxemic respiratory failure.    Critical care was time spent personally by me on the following  activities:  Development of treatment plan with patient or surrogate,  discussions with consultants, evaluation of patient's response to  treatment, examination of patient, obtaining history from patient or  surrogate, ordering and performing treatments and interventions, ordering  and review of laboratory studies and re-evaluation of patient's condition   I assumed direction of critical care for this patient from another  provider in my specialty: no       *This note was dictated using voice  recognition software/Dragon.  Despite best efforts to proofread, errors can occur which can change  the meaning.  Any change was purely unintentional.

## 2021-02-20 NOTE — Progress Notes (Signed)
Pt was suctioned for a small amount of thick white secretions. Per Dr. Kayleen Memos, he was extubated to comfort care.

## 2021-02-20 NOTE — Progress Notes (Signed)
Plan to initiate comfort care measures within the hour.  Patients friends at bedside. Patients nephew virtually present via [place].

## 2021-02-20 DEATH — deceased

## 2021-02-22 LAB — CULTURE, FUNGUS WITHOUT SMEAR

## 2021-03-17 LAB — ACID FAST CULTURE WITH REFLEXED SENSITIVITIES (MYCOBACTERIA): Acid Fast Culture: NEGATIVE

## 2022-04-08 IMAGING — DX DG CHEST 1V PORT
1 series · 1 of 1 positions shown · non-contrast
Comparison: 02/02/2021

CLINICAL DATA: Respiratory failure, check endotracheal tube

EXAM:
PORTABLE CHEST 1 VIEW

[chest ap]
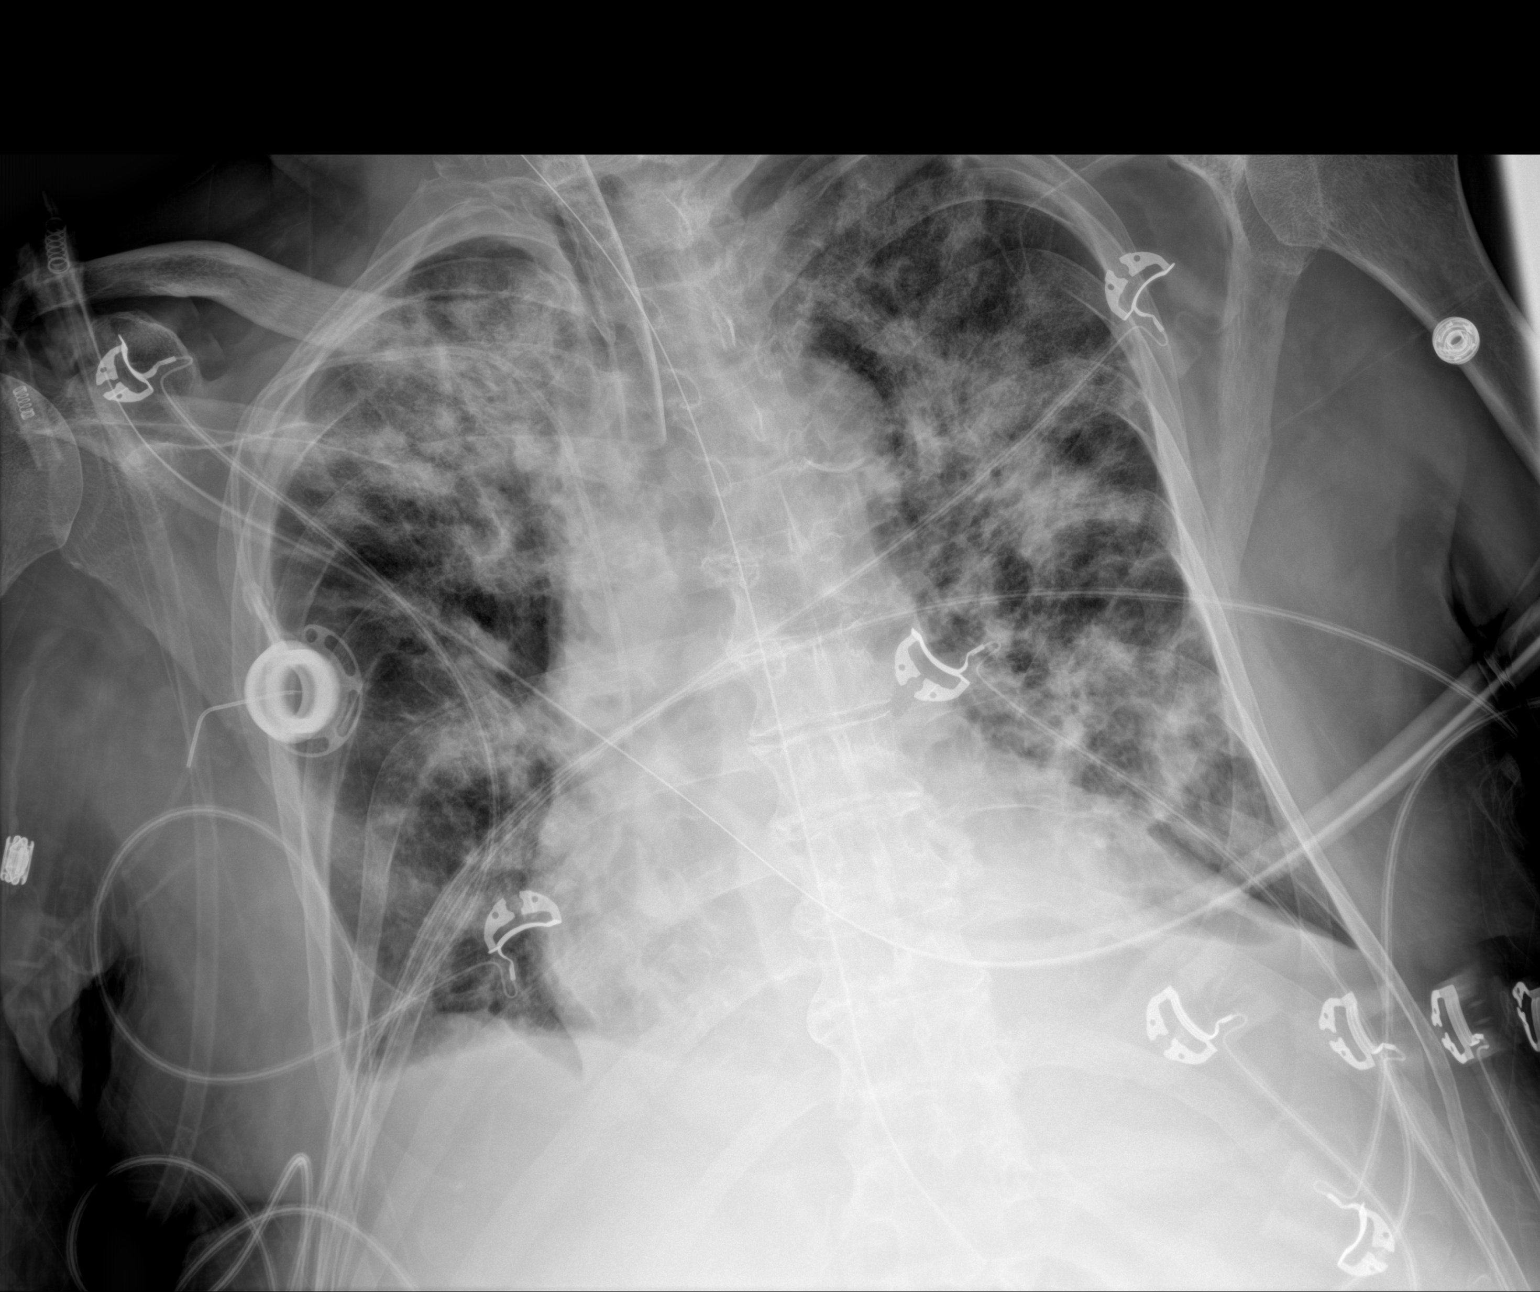

[1 of 1 positions shown; findings below may reference images not displayed]

FINDINGS: Endotracheal tube is noted in satisfactory position. Gastric
catheter extends into the stomach. Right chest wall port is noted in
satisfactory position. Diffuse bilateral airspace opacities are
again identified mildly improved from the prior study. No new focal
infiltrate is seen.
IMPRESSION: Slight improvement in multifocal pneumonia.

Tubes and lines as described.

## 2022-04-08 IMAGING — DX DG ABD PORTABLE 1V
2 series · 2 of 2 positions shown · non-contrast
Comparison: 02/05/2021

CLINICAL DATA: NG placement

EXAM:
PORTABLE ABDOMEN - 1 VIEW

[abdomen supine (1 of 2)]
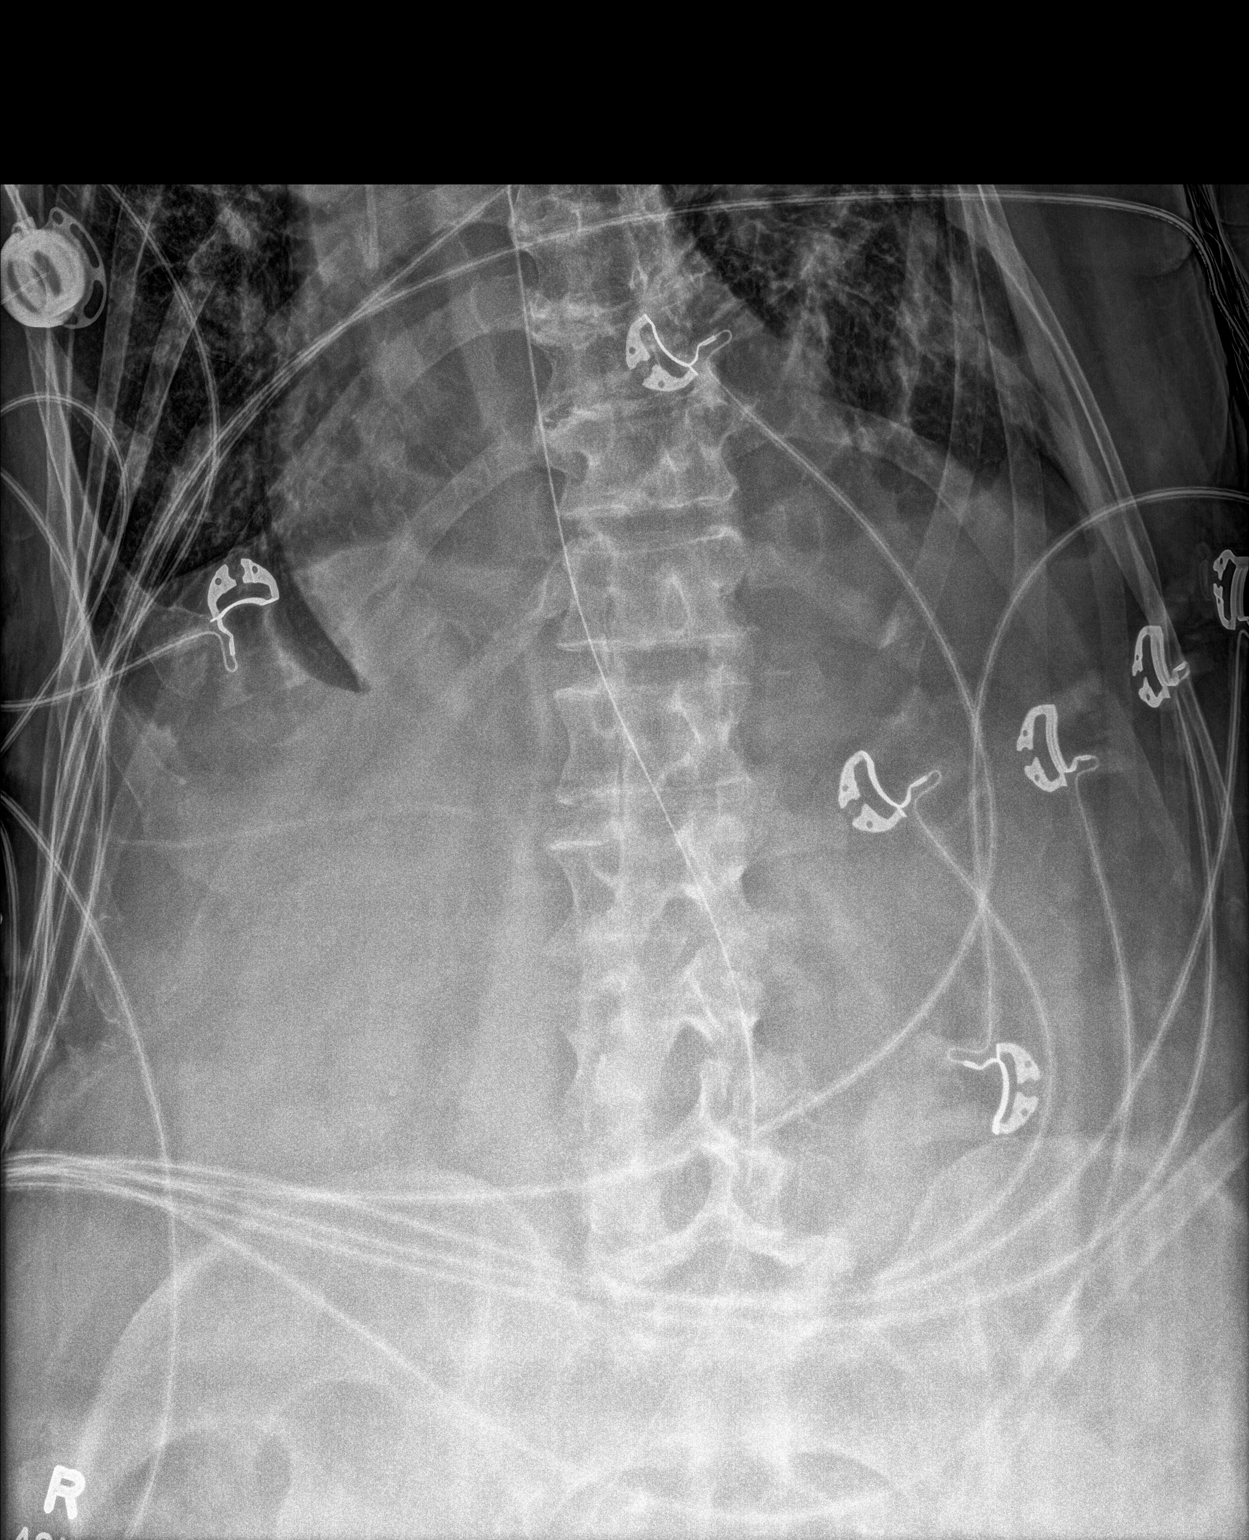

[abdomen supine (2 of 2)]
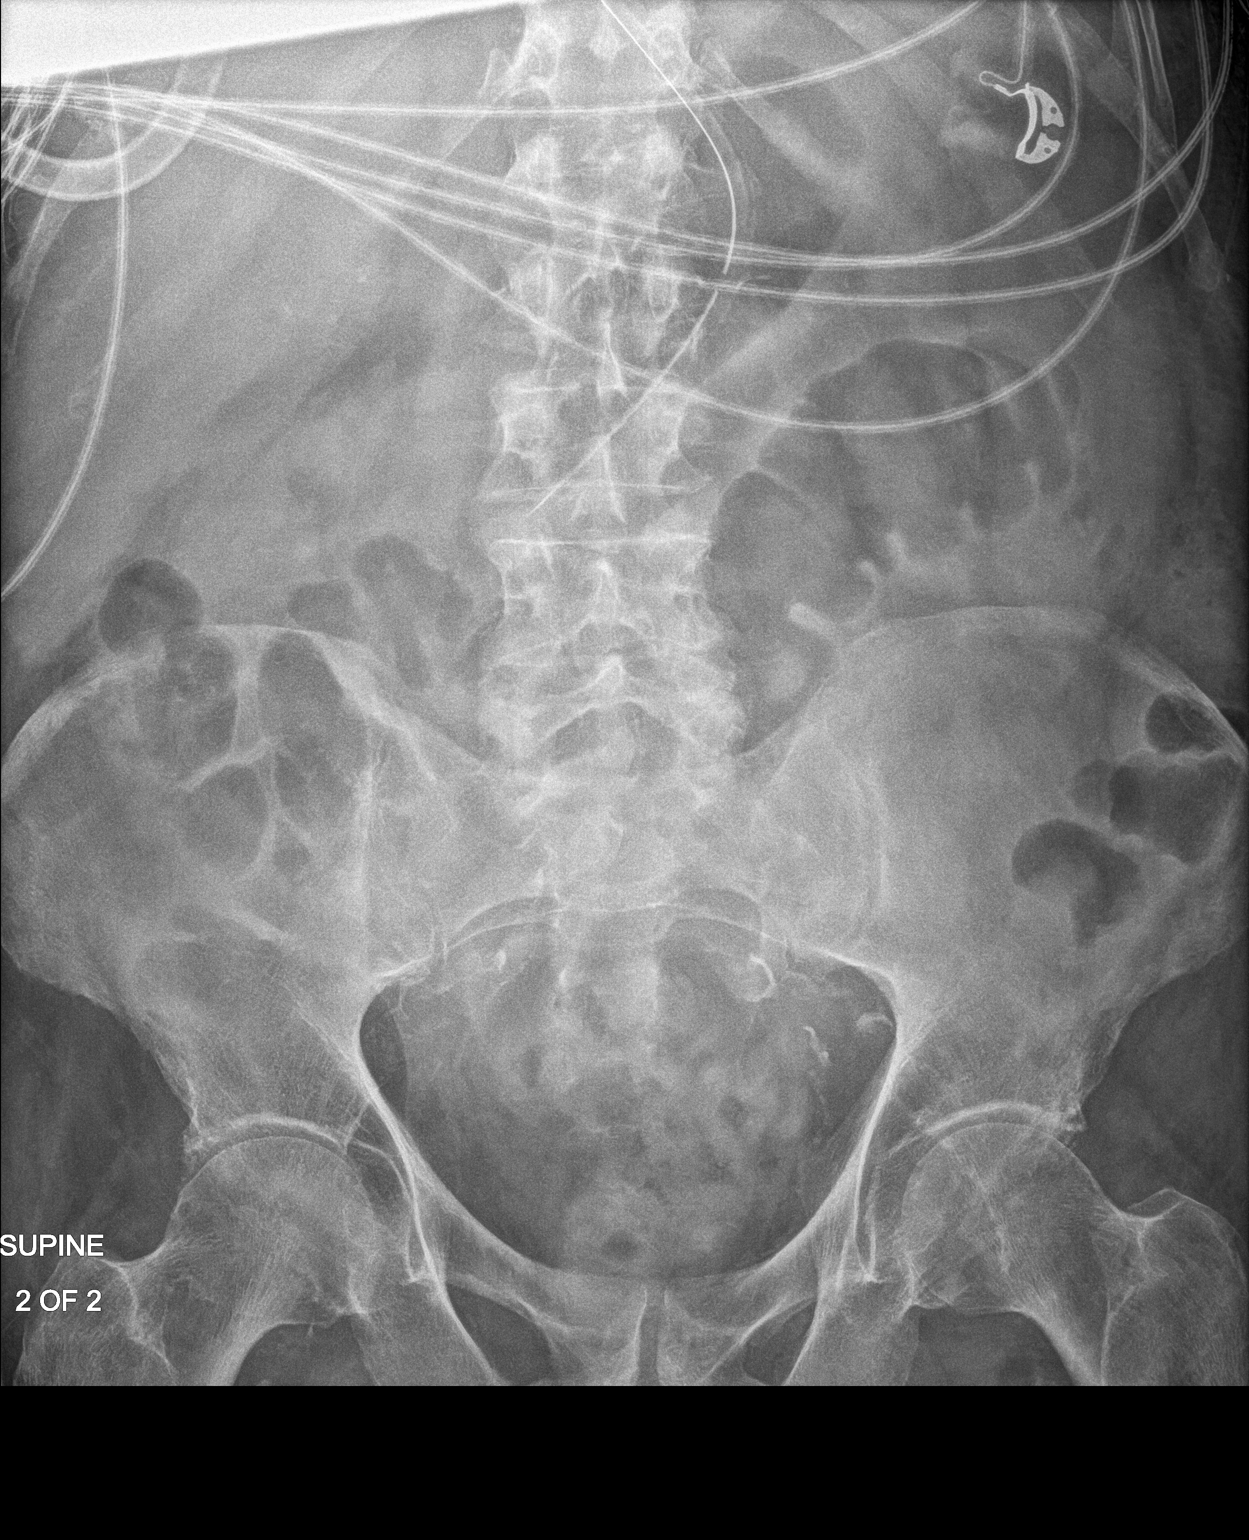

[2 of 2 positions shown; findings below may reference images not displayed]

FINDINGS: NG tube has been advanced with the tip now in the antrum of the
stomach. No dilated bowel loops identified.
IMPRESSION: NG tube advanced to the antrum of the stomach. No dilated bowel
loops.

## 2022-06-23 LAB — HISTOPLASMA GAL'MANNAN AG SER: Histoplasma Gal'mannan Ag Ser: <0.5 (ref <0.5)
